# Patient Record
Sex: Male | Born: 1970
Health system: Southern US, Community
[De-identification: ages and names within clinical notes are randomized; demographics above are authoritative.]

## PROBLEM LIST (undated history)

## (undated) DIAGNOSIS — K5792 Diverticulitis of intestine, part unspecified, without perforation or abscess without bleeding: Secondary | ICD-10-CM

## (undated) DIAGNOSIS — R7982 Elevated C-reactive protein (CRP): Secondary | ICD-10-CM

## (undated) DIAGNOSIS — I10 Essential (primary) hypertension: Secondary | ICD-10-CM

## (undated) DIAGNOSIS — Z87442 Personal history of urinary calculi: Secondary | ICD-10-CM

## (undated) DIAGNOSIS — E669 Obesity, unspecified: Secondary | ICD-10-CM

## (undated) DIAGNOSIS — N289 Disorder of kidney and ureter, unspecified: Secondary | ICD-10-CM

## (undated) DIAGNOSIS — M1990 Unspecified osteoarthritis, unspecified site: Secondary | ICD-10-CM

## (undated) DIAGNOSIS — Z8639 Personal history of other endocrine, nutritional and metabolic disease: Secondary | ICD-10-CM

## (undated) DIAGNOSIS — G4730 Sleep apnea, unspecified: Secondary | ICD-10-CM

## (undated) DIAGNOSIS — C61 Malignant neoplasm of prostate: Secondary | ICD-10-CM

## (undated) DIAGNOSIS — R972 Elevated prostate specific antigen [PSA]: Secondary | ICD-10-CM

## (undated) DIAGNOSIS — K219 Gastro-esophageal reflux disease without esophagitis: Secondary | ICD-10-CM

## (undated) DIAGNOSIS — K209 Esophagitis, unspecified without bleeding: Secondary | ICD-10-CM

## (undated) DIAGNOSIS — K2970 Gastritis, unspecified, without bleeding: Secondary | ICD-10-CM

## (undated) HISTORY — DX: Personal history of other endocrine, nutritional and metabolic disease: Z86.39

## (undated) HISTORY — DX: Gastro-esophageal reflux disease without esophagitis: K21.9

## (undated) HISTORY — PX: PROSTATE BIOPSY: SHX241

## (undated) HISTORY — DX: Gastritis, unspecified, without bleeding: K29.70

## (undated) HISTORY — PX: ROTATOR CUFF REPAIR: SHX139

## (undated) HISTORY — PX: KNEE ARTHROSCOPY: SUR90

## (undated) HISTORY — PX: FINGER SURGERY: SHX640

## (undated) HISTORY — DX: Unspecified osteoarthritis, unspecified site: M19.90

## (undated) HISTORY — DX: Obesity, unspecified: E66.9

## (undated) HISTORY — PX: TONSILLECTOMY AND ADENOIDECTOMY: SHX28

## (undated) HISTORY — DX: Elevated C-reactive protein (CRP): R79.82

## (undated) HISTORY — DX: Esophagitis, unspecified without bleeding: K20.90

## (undated) HISTORY — DX: Diverticulitis of intestine, part unspecified, without perforation or abscess without bleeding: K57.92

## (undated) HISTORY — DX: Elevated prostate specific antigen (PSA): R97.20

---

## 1998-02-09 ENCOUNTER — Ambulatory Visit (HOSPITAL_COMMUNITY): Admission: RE | Admit: 1998-02-09 | Discharge: 1998-02-09 | Payer: Self-pay | Admitting: Gastroenterology

## 1998-03-19 ENCOUNTER — Ambulatory Visit: Admission: RE | Admit: 1998-03-19 | Discharge: 1998-03-19 | Payer: Self-pay | Admitting: Internal Medicine

## 1998-08-30 ENCOUNTER — Ambulatory Visit: Admission: RE | Admit: 1998-08-30 | Discharge: 1998-08-30 | Payer: Self-pay | Admitting: Internal Medicine

## 2001-12-09 ENCOUNTER — Ambulatory Visit (HOSPITAL_COMMUNITY): Admission: RE | Admit: 2001-12-09 | Discharge: 2001-12-10 | Payer: Self-pay | Admitting: Otolaryngology

## 2001-12-09 ENCOUNTER — Encounter: Payer: Self-pay | Admitting: Otolaryngology

## 2001-12-09 ENCOUNTER — Encounter (INDEPENDENT_AMBULATORY_CARE_PROVIDER_SITE_OTHER): Payer: Self-pay | Admitting: *Deleted

## 2005-11-15 ENCOUNTER — Emergency Department (HOSPITAL_COMMUNITY): Admission: EM | Admit: 2005-11-15 | Discharge: 2005-11-15 | Payer: Self-pay | Admitting: Emergency Medicine

## 2007-10-20 ENCOUNTER — Emergency Department (HOSPITAL_BASED_OUTPATIENT_CLINIC_OR_DEPARTMENT_OTHER): Admission: EM | Admit: 2007-10-20 | Discharge: 2007-10-20 | Payer: Self-pay | Admitting: Emergency Medicine

## 2008-05-02 ENCOUNTER — Ambulatory Visit: Payer: Self-pay | Admitting: *Deleted

## 2008-05-03 ENCOUNTER — Observation Stay (HOSPITAL_COMMUNITY): Admission: EM | Admit: 2008-05-03 | Discharge: 2008-05-05 | Payer: Self-pay | Admitting: Emergency Medicine

## 2008-05-03 ENCOUNTER — Encounter (INDEPENDENT_AMBULATORY_CARE_PROVIDER_SITE_OTHER): Payer: Self-pay | Admitting: Internal Medicine

## 2009-09-29 ENCOUNTER — Ambulatory Visit: Payer: Self-pay | Admitting: Diagnostic Radiology

## 2009-09-29 ENCOUNTER — Encounter: Payer: Self-pay | Admitting: Emergency Medicine

## 2009-09-29 ENCOUNTER — Observation Stay (HOSPITAL_COMMUNITY): Admission: EM | Admit: 2009-09-29 | Discharge: 2009-09-30 | Payer: Self-pay | Admitting: Internal Medicine

## 2010-04-27 LAB — CBC
HCT: 42.6 % (ref 39.0–52.0)
Hemoglobin: 14.2 g/dL (ref 13.0–17.0)
MCH: 30.2 pg (ref 26.0–34.0)
MCV: 90.3 fL (ref 78.0–100.0)
Platelets: 199 10*3/uL (ref 150–400)
Platelets: 174 10*3/uL (ref 150–400)
RBC: 4.72 MIL/uL (ref 4.22–5.81)
RBC: 4.41 MIL/uL (ref 4.22–5.81)
RDW: 14.4 % (ref 11.5–15.5)
WBC: 7.9 10*3/uL (ref 4.0–10.5)

## 2010-04-27 LAB — POCT CARDIAC MARKERS: Troponin i, poc: <0.05 ng/mL (ref 0.00–0.09)

## 2010-04-27 LAB — COMPREHENSIVE METABOLIC PANEL
AST: 31 U/L (ref 0–37)
Albumin: 3.8 g/dL (ref 3.5–5.2)
Alkaline Phosphatase: 64 U/L (ref 39–117)
BUN: 13 mg/dL (ref 6–23)
Chloride: 106 mEq/L (ref 96–112)
Creatinine, Ser: 1.15 mg/dL (ref 0.4–1.5)
GFR calc Af Amer: >60 mL/min (ref >60)
Potassium: 3.8 mEq/L (ref 3.5–5.1)
Total Bilirubin: 0.8 mg/dL (ref 0.3–1.2)
Total Protein: 6.9 g/dL (ref 6.0–8.3)

## 2010-04-27 LAB — D-DIMER, QUANTITATIVE: D-Dimer, Quant: 0.29 ug/mL-FEU (ref 0.00–0.48)

## 2010-04-27 LAB — GLUCOSE, CAPILLARY
Glucose-Capillary: 103 mg/dL — ABNORMAL HIGH (ref 70–99)
Glucose-Capillary: 114 mg/dL — ABNORMAL HIGH (ref 70–99)
Glucose-Capillary: 143 mg/dL — ABNORMAL HIGH (ref 70–99)

## 2010-04-27 LAB — CARDIAC PANEL(CRET KIN+CKTOT+MB+TROPI)
CK, MB: 2.1 ng/mL (ref 0.3–4.0)
Relative Index: 0.2 (ref 0.0–2.5)
Total CK: 931 U/L — ABNORMAL HIGH (ref 7–232)
Total CK: 948 U/L — ABNORMAL HIGH (ref 7–232)
Troponin I: 0.02 ng/mL (ref 0.00–0.06)
Troponin I: 0.02 ng/mL (ref 0.00–0.06)
Troponin I: 0.03 ng/mL (ref 0.00–0.06)

## 2010-04-27 LAB — LIPID PANEL
LDL Cholesterol: 35 mg/dL (ref 0–99)
Total CHOL/HDL Ratio: 2.6 RATIO
Triglycerides: 84 mg/dL (ref <150)
VLDL: 17 mg/dL (ref 0–40)

## 2010-04-27 LAB — DIFFERENTIAL
Eosinophils Absolute: 0.1 10*3/uL (ref 0.0–0.7)
Eosinophils Relative: 1 % (ref 0–5)
Lymphocytes Relative: 28 % (ref 12–46)
Lymphs Abs: 2.7 10*3/uL (ref 0.7–4.0)
Monocytes Absolute: 0.8 10*3/uL (ref 0.1–1.0)
Monocytes Relative: 8 % (ref 3–12)

## 2010-04-27 LAB — BASIC METABOLIC PANEL
CO2: 30 mEq/L (ref 19–32)
Chloride: 101 mEq/L (ref 96–112)
Creatinine, Ser: 1.4 mg/dL (ref 0.4–1.5)
GFR calc Af Amer: >60 mL/min (ref >60)
Potassium: 3.9 mEq/L (ref 3.5–5.1)

## 2010-05-24 LAB — BASIC METABOLIC PANEL
BUN: 11 mg/dL (ref 6–23)
BUN: 9 mg/dL (ref 6–23)
CO2: 25 mEq/L (ref 19–32)
Calcium: 8.9 mg/dL (ref 8.4–10.5)
Chloride: 104 mEq/L (ref 96–112)
Chloride: 106 mEq/L (ref 96–112)
Chloride: 106 mEq/L (ref 96–112)
GFR calc Af Amer: >60 mL/min (ref >60)
GFR calc non Af Amer: >60 mL/min (ref >60)
GFR calc non Af Amer: >60 mL/min (ref >60)
GFR calc non Af Amer: >60 mL/min (ref >60)
Glucose, Bld: 110 mg/dL — ABNORMAL HIGH (ref 70–99)
Glucose, Bld: 122 mg/dL — ABNORMAL HIGH (ref 70–99)
Glucose, Bld: 133 mg/dL — ABNORMAL HIGH (ref 70–99)
Potassium: 3.8 mEq/L (ref 3.5–5.1)
Potassium: 4.1 mEq/L (ref 3.5–5.1)
Potassium: 4.9 mEq/L (ref 3.5–5.1)
Sodium: 134 mEq/L — ABNORMAL LOW (ref 135–145)
Sodium: 138 mEq/L (ref 135–145)
Sodium: 140 mEq/L (ref 135–145)

## 2010-05-24 LAB — CBC
HCT: 40.3 % (ref 39.0–52.0)
HCT: 41.5 % (ref 39.0–52.0)
HCT: 42.3 % (ref 39.0–52.0)
Hemoglobin: 13.8 g/dL (ref 13.0–17.0)
Hemoglobin: 14.3 g/dL (ref 13.0–17.0)
MCV: 89 fL (ref 78.0–100.0)
MCV: 89.2 fL (ref 78.0–100.0)
MCV: 89.5 fL (ref 78.0–100.0)
Platelets: 174 10*3/uL (ref 150–400)
Platelets: 177 10*3/uL (ref 150–400)
Platelets: 214 10*3/uL (ref 150–400)
RBC: 4.75 MIL/uL (ref 4.22–5.81)
RDW: 14.7 % (ref 11.5–15.5)
RDW: 14.8 % (ref 11.5–15.5)
RDW: 14.8 % (ref 11.5–15.5)
WBC: 6.8 10*3/uL (ref 4.0–10.5)
WBC: 8.9 10*3/uL (ref 4.0–10.5)

## 2010-05-24 LAB — TROPONIN I: Troponin I: 0.12 ng/mL — ABNORMAL HIGH (ref 0.00–0.06)

## 2010-05-24 LAB — GLUCOSE, CAPILLARY
Glucose-Capillary: 107 mg/dL — ABNORMAL HIGH (ref 70–99)
Glucose-Capillary: 117 mg/dL — ABNORMAL HIGH (ref 70–99)
Glucose-Capillary: 133 mg/dL — ABNORMAL HIGH (ref 70–99)
Glucose-Capillary: 166 mg/dL — ABNORMAL HIGH (ref 70–99)
Glucose-Capillary: 79 mg/dL (ref 70–99)

## 2010-05-24 LAB — CARDIAC PANEL(CRET KIN+CKTOT+MB+TROPI)
CK, MB: 1.1 ng/mL (ref 0.3–4.0)
Relative Index: 0.2 (ref 0.0–2.5)
Relative Index: 0.2 (ref 0.0–2.5)

## 2010-05-24 LAB — DIFFERENTIAL
Eosinophils Absolute: 0.1 10*3/uL (ref 0.0–0.7)
Eosinophils Relative: 2 % (ref 0–5)
Lymphocytes Relative: 29 % (ref 12–46)
Lymphs Abs: 2.5 10*3/uL (ref 0.7–4.0)
Monocytes Absolute: 0.7 10*3/uL (ref 0.1–1.0)
Monocytes Relative: 8 % (ref 3–12)

## 2010-05-24 LAB — LIPID PANEL
Cholesterol: 133 mg/dL (ref 0–200)
LDL Cholesterol: 95 mg/dL (ref 0–99)
Triglycerides: 30 mg/dL (ref <150)

## 2010-05-24 LAB — POCT CARDIAC MARKERS
CKMB, poc: <1 ng/mL — ABNORMAL LOW (ref 1.0–8.0)
CKMB, poc: <1 ng/mL — ABNORMAL LOW (ref 1.0–8.0)

## 2010-05-24 LAB — D-DIMER, QUANTITATIVE: D-Dimer, Quant: 0.42 ug/mL-FEU (ref 0.00–0.48)

## 2010-05-24 LAB — HEMOGLOBIN A1C: Mean Plasma Glucose: 140 mg/dL

## 2010-05-24 LAB — CK TOTAL AND CKMB (NOT AT ARMC): Relative Index: 0.7 (ref 0.0–2.5)

## 2010-05-24 LAB — TSH: TSH: 2.446 u[IU]/mL (ref 0.350–4.500)

## 2010-05-24 LAB — APTT: aPTT: 32 seconds (ref 24–37)

## 2010-06-04 ENCOUNTER — Other Ambulatory Visit: Payer: Self-pay | Admitting: Emergency Medicine

## 2010-06-04 DIAGNOSIS — M25561 Pain in right knee: Secondary | ICD-10-CM

## 2010-06-08 ENCOUNTER — Ambulatory Visit
Admission: RE | Admit: 2010-06-08 | Discharge: 2010-06-08 | Disposition: A | Discharge status: Home / Self Care | Payer: BC Managed Care – PPO | Source: Ambulatory Visit | Attending: Emergency Medicine | Admitting: Emergency Medicine

## 2010-06-08 DIAGNOSIS — M25561 Pain in right knee: Secondary | ICD-10-CM

## 2010-06-26 NOTE — Discharge Summary (Signed)
NAME:  CHA, Edgar Kidd NO.:  [id number]   MEDICAL RECORD NO.:  [id number]           PATIENT TYPE:   LOCATION:                                 FACILITY:   PHYSICIAN:  Ruthy Dick, MD    DATE OF BIRTH:  Jul 13, 1970   DATE OF ADMISSION:  DATE OF DISCHARGE:                               DISCHARGE SUMMARY   REASON FOR ADMISSION:  Chest pain.   FINAL DISCHARGE DIAGNOSES:  1. Atypical chest pain.  2. Hypertension.  3. Morbid obesity.  4. Obstructive sleep apnea.  5. Diabetes mellitus, type 2.  6. Gastroesophageal reflux disease.   CONSULTATIONS DURING THIS ADMISSION:  Cardiology consult.   PROCEDURES DONE DURING THIS ADMISSION:  1. A 2-D echocardiogram, which showed a 55-65% ejection fraction with      no wall abnormalities.  2. Left heart catheterization was done and was read as no      angiographically significant coronary artery disease but with large      tortuous coronary arteries.  They flow in all coronary arteries was      slightly sluggish, possibly due to underlying microvascular      disease, and ejection fraction was 65%.   HISTORY OF PRESENTING ILLNESS:  This is a 40 year old African American  male with a past medical history significant for morbid obesity;  diabetes mellitus, type 2; hypertension; gastroesophageal reflux disease  who came into the hospital with chest pain, which he described as  pressure.  Because of this, he was admitted to rule out myocardial  infarction.  Serial enzymes and EKG were mostly negative except for the  first set, which showed a troponin of 0.12.  Because of this and the  fact that his chest pain was recurrent, he was recommended for left  heart catheterization.  The result as noted above did not show any  significant coronary artery disease.  Their recommendation is for the  patient to have risk factor modifications, which includes obesity,  hypertension, diabetes mellitus, and also to be started on Imdur 30 mg  daily.  He is to follow up with Dr. Anne Fu on Tuesday, May 17, 2008, at  10:30, also to follow up with his primary care doctor at [facility], Dr. Kizzie Fantasia, in 1 week.  The patient is to call for this  appointment.   DISCHARGE MEDICATIONS:  1. Imdur 30 mg once daily.  2. Aspirin 81 mg p.o. daily, over the counter.  3. Metformin 500 mg p.o. daily as previously taken at home.  4. Diovan 160 mg p.o. daily.  5. Prevacid 30 mg p.o. daily.  6. Vitamin D 1 tablet p.o. daily.   PHYSICAL EXAMINATION:  GENERAL:  He has no major complaints today.  He  denies chest pain but says that he does have a slight discomfort in the  area but not when compared to when he came to the hospital.  Denies  shortness of breath.  Denies abdominal pain, nausea, vomiting, diarrhea,  constipation, syncope.  HEENT:  Normocephalic, atraumatic.  Pupils equal, round, and reactive to  light.  Extraocular muscles intact.  Nares  patent.  VITAL SIGNS:  Temperature 98.1, pulse 95, respirations 20, blood  pressure 106/62 and saturating 100% on room air.  CHEST:  Clear to auscultation bilaterally.  ABDOMEN:  Soft, nontender.  EXTREMITIES:  No clubbing, cyanosis, or edema.  CARDIOVASCULAR:  First and second heart sounds only.  CENTRAL NERVOUS SYSTEM:  Nonfocal.   The patient was advised to be on high fiber diet for his morbid obesity.   TIME USED FOR DISCHARGE PLANNING:  Greater than 30 minutes.      Ruthy Dick, MD  Electronically Signed     GU/MEDQ  D:  05/05/2008  T:  05/05/2008  Job:  [id number]

## 2010-06-26 NOTE — Consult Note (Signed)
NAME:  Edgar Kidd, Edgar Kidd NO.:  [id number]   MEDICAL RECORD NO.:  [id number]          PATIENT TYPE:  OBV   LOCATION:  1824                         FACILITY:  [facility]   PHYSICIAN:  Unice Cobble, MD     DATE OF BIRTH:  Dec 02, 1970   DATE OF CONSULTATION:  05/03/2008  DATE OF DISCHARGE:                                 CONSULTATION   CHIEF COMPLAINT:  Chest pain.   HISTORY OF PRESENT ILLNESS:  This is a 40 year old African American male  with a history of borderline diabetes and hypertension and low vitamin D  who presents with chest pain.  The chest pain occurred while driving at  1610.  It was substernal and radiating to his left side and was  associated with shortness of breath and feeling hot.  The pain continued  unabated until seen in the emergency department at 2300.  Exertion and  rest did not affect the pain.  The patient was given morphine which  helped with the pain considerably.  He was thereafter given  nitroglycerin and aspirin which the patient is unsure whether it had an  affect.  He has never had pain like this before.  No palpitations or  presyncope associated with it.  The pain did not radiate to his back.  No edema, PND or orthopnea.  He can exert himself freely otherwise.   PAST MEDICAL HISTORY:  1. Borderline diabetes.  2. Hypertension.  3. Low vitamin D.  4. GERD.  5. Status post rotator cuff repair.  6. Status post sleep apnea surgery.  7. Status post right hand surgery.   ALLERGIES:  SHELLFISH.   MEDICATIONS:  1. Diovan 160 mg daily.  2. Vitamin D 5000 units weekly.  3. Glucophage 500 mg daily.  4. Prevacid.   SOCIAL HISTORY:  He lives in [place] with his wife and two kids.  He  drives a truck for occupation.  He does not smoke.  Occasional alcohol.  No drugs.   FAMILY HISTORY:  Noncontributory.   REVIEW OF SYSTEMS:  A complete review of systems done and found to be  otherwise negative except as stated in the HPI.   PHYSICAL  EXAMINATION:  VITAL SIGNS:  Temperature is afebrile with a  pulse of 80, respiratory rate is 20, blood pressure is 154/96 (this was  retaken with a larger cuff and was found to be in the 120s systolic (O2  saturations are 96% on 2 liters).  GENERAL:  No acute distress.  Very obese.  HEENT:  Shows NCAT, PERRLA, EOMI, MMM.  Oropharynx without erythema or  exudates.  NECK:  Supple without lymphadenopathy, thyromegaly, bruits or  jugulovenous distention.  HEART:  Regular rate and rhythm with normal S1-S2 without murmurs,  gallops or rubs.  Normal PMI.  Pulses are 2+ and equal bilaterally  without bruits.  LUNGS:  Clear to auscultation bilaterally.  SKIN:  No rashes or lesions.  ABDOMEN:  Soft and nontender with normal bowel sounds.  No rebound or  guarding.  EXTREMITIES:  Showed no cyanosis, clubbing or edema.  Pulses  are 2+ and equal bilaterally.  There is no brachial femoral delay.  MUSCULOSKELETAL:  Shows no joint deformity, effusions, spine or CVA  tenderness.  His chest pain is not reproducible.  NEUROLOGICAL:  He is alert and oriented x3 with cranial nerves II-XII  grossly intact.  Strength is 5/5 in all extremities and axial groups.   RADIOLOGY:  Chest x-ray shows mild cardiomegaly.  EKG shows a rate of  83, normal sinus rhythm.  He has lateral T-wave flattening and left  anterior fascicular block.  Borderline left ventricular hypertrophy.   LABORATORY DATA:  Normal white count and hemoglobin.  Creatinine is 1.  CK-MB is negative x2.  D-dimer is 0.42, which is also negative.   ASSESSMENT/PLAN:  This is a 40 year old black male with history of  diabetes and hypertension who presents with chest pain and lateral T-  wave flattening on ECG.  Based on the character and duration of the  pain, I suspect a noncoronary etiology, especially with his negative  troponins after 10 hours of pain.  The differential diagnosis includes  angina, but also includes pericarditis, aortic dissection,  esophageal  spasm, pleural effusion, or other noncardiac pain.  I recommend ruling  out for myocardial infarction overnight with serial enzymes and EKGs.  If these are negative, then he could have a cardiac stress test in the  morning. His lipid profile should be checked and a statin started if his  LDL is greater than 100 since he is a diabetic.  Glucophage should be  held for possible catheterization.  Continue Diovan.  Check ESR for  possible pericarditis.  Control chest pain with nitroglycerin  transdermally or intravenously or sublingually if possible.  Consider  repeating his AP and lateral chest x-ray to better evaluate his  mediastinum, which appears wide, flat and rotated on his portable chest  x-ray.  We will follow along with you and reevaluate in the morning.  Thank you very much for this consultation.      Unice Cobble, MD  Electronically Signed     ACJ/MEDQ  D:  05/03/2008  T:  05/03/2008  Job:  [id number]

## 2010-06-26 NOTE — H&P (Signed)
NAME:  Edgar Kidd, Edgar Kidd NO.:  [id number]   MEDICAL RECORD NO.:  [id number]          PATIENT TYPE:  OBV   LOCATION:  4731                         FACILITY:  [facility]   PHYSICIAN:  Ruthy Dick, MD    DATE OF BIRTH:  [birth year]   DATE OF ADMISSION:  05/02/2008  DATE OF DISCHARGE:                              HISTORY & PHYSICAL   CHIEF COMPLAINT:  Chest pain which started about 1 o'clock yesterday.   HISTORY OF PRESENT ILLNESS:  Edgar Kidd is a 40 year old African  American male with a past medical history significant for morbid  obesity, diabetes mellitus type 2, hypertension, gastroesophageal reflux  disease, and obstructive sleep apnea for which he uses CPAP at home who  said that he started having chest pain yesterday afternoon, that is  March 22, at about 1 p.m.  According to him, the chest time was about  6/10, intensity was more like sharp and also pressure-like in nature,  and radiating sometimes to his left arm, but otherwise no nausea or  vomiting.  He did complain of shortness of breath which he said has been  going on for some time now.  He has baseline shortness of breath because  he has sleep apnea and morbid obesity.  Denied abdominal pain.  Denied  diarrhea, constipation, hematochezia, melanotic stools.  He denies  dysuria, frequency, urgency.  He denies headache, diplopia, photophobia.  Denies syncope.  The patient denies claudication.  Denies rash.  Denies  diaphoresis during this episode.  Denies fever and chills.   PAST MEDICAL HISTORY:  Gastroesophageal reflux disease, diabetes  mellitus type 2, hypertension, obstructive sleep apnea, morbid obesity.   SOCIAL HISTORY:  Lives at home.  Denies tobacco and illicit drug use,  and says he drinks only occasionally.   FAMILY HISTORY:  Significant for mother with diabetes and father with  hypertension.  No heart disease at this time.  Other family members also  have diabetes and hypertension.   MEDICATIONS:  1. Diovan 160 mg daily.  2. Prevacid 30 mg daily.  3. Vitamin D.  4. Glucophage 500 mg daily.   ALLERGIES:  He is allergic to [facility].   REVIEW OF SYSTEMS:  A 12-point review of systems was done was negative  except noted in the history of presenting illness.   PHYSICAL EXAMINATION:  GENERAL:  Seen and examined in the emergency  room.  Alert and oriented x3 in no acute cardiopulmonary distress.  VITAL SIGNS:  Temperature 96.1, pulse 80, respirations 20, blood  pressure 165/111, but he says this was done with a cuff on his forearm  since there was no cuff big enough for his upper arm.  We plan to redo  his blood pressure.  Saturation 98% on room air.  HEENT:  Normocephalic, atraumatic.  Pupils equal, round, and reactive to  light.  Extraocular muscles intact.  Nares patent.  NECK:  Supple.  No JVD.  No lymphadenopathy.  No thyromegaly.  CHEST:  Clear to auscultation bilaterally.  No rhonchi, no rales, no  wheezing.  ABDOMEN:  Soft, nontender.  No hepatosplenomegaly.  EXTREMITIES:  No clubbing,  no cyanosis, no edema.  CARDIOVASCULAR:  First and second heart sounds only.  CENTRAL NERVOUS SYSTEM:  Nonfocal.  Cranial nerves intact II through  XII.  The patient has normal reflexes and power is 5/5 globally.   LABS AND INVESTIGATIONS:  CBC with differential is fairly normal.  D-  dimer is 0.42.  Glucose is 140, sodium 140, potassium 3.8, BUN 12,  creatinine 1.02.  CK-MB was less than 1.0 and troponin less than 0.05.  Myoglobin 86.  Chest x-rays read as mild cardiac enlargement with  questionable minimal bibasilar atelectasis and no acute infiltrates.  EKG shows what appears to be RSR pattern in V2 and possibly representing  a left anterior fascicular block.   ASSESSMENT:  1. Chest pain, rule out myocardial infarction.  2. Morbid obesity.  3. Hypertension.  4. Obstructive sleep apnea.  5. Diabetes mellitus type 2.  6. Gastroesophageal reflux disease.  7. Abnormal  electrocardiogram.   PLAN:  The patient will be admitted on observation to be ruled out for  myocardial infarction with serial enzymes and EKGs.  We would watch his  blood pressure very closely and try to keep it under control if the  numbers noted above or actually correct.  We will also have him on  sliding scale insulin.  When the patient rules out for myocardial  infarction with enzymes, we will plan for the patient to have either  inpatient stress test or outpatient stress test.  However, since the  patient does have also a slightly abnormal EKG and has other numerous  risk factors, we will plan to get Cardiology involved in his case.  In  the meantime, we will get a TSH and lipid panel in the morning.      Ruthy Dick, MD  Electronically Signed     GU/MEDQ  D:  05/03/2008  T:  05/03/2008  Job:  [id number]

## 2010-06-26 NOTE — Cardiovascular Report (Signed)
NAME:  Edgar Kidd, Edgar Kidd NO.:  [id number]   MEDICAL RECORD NO.:  [id number]          PATIENT TYPE:  OBV   LOCATION:  4738                         FACILITY:  [facility]   PHYSICIAN:  Jake Bathe, MD      DATE OF BIRTH:  1970-05-26   DATE OF PROCEDURE:  05/04/2008  DATE OF DISCHARGE:                            CARDIAC CATHETERIZATION   PROCEDURE:  1. Left heart catheterization  2. Selective coronary angiography.  3. Left ventriculogram.   INDICATIONS:  Unstable angina.  A 40 year old [place] American male with  diabetes, hypertension, morbid obesity, and obstructive sleep apnea, who  is admitted with chest discomfort and hypertension.  The first set of  cardiac biomarkers were positive with a troponin of 0.12 with subsequent  markers being in the normal range.  He still continued to have ongoing  chest discomfort, therefore cardiac catheterization was pursued.   PROCEDURE IN DETAILS:  Informed consent was obtained.  Risk of stroke,  heart attack, death, renal impairment, bleeding, and arterial damage  were explained to the patient at length.  He was placed on  catheterization table and prepped in a sterile fashion.  Fluoroscopic  visualization of the femoral head was obtained.  A single stick into the  right femoral artery was obtained and using the modified Seldinger  technique a 5-French sheath was placed in the right femoral artery.  A  Judkins left #5 catheter was used to selectively cannulate the left main  artery.  A left #4 catheter did not selectively fit.  This catheter was  then exchanged for a JR-4 catheter which was used to selectively  cannulate the right coronary artery.  Multiple views with hand injection  of Omnipaque were obtained.  This catheter was then exchanged for an  angle pigtail which was used across the aortic valve in the left  ventricle.  Hemodynamics were obtained in the RAO position using power  injection of 32 mL of contrast.  A  ventriculogram was performed.  Following this, a pullback was obtained across the aortic valve.  Following the procedure, sheath was removed.  He was hemodynamically  stable without any difficulty.  He did receive Lovenox injection at 6  a.m. the morning of catheterization.   FINDINGS:  1. Left main artery - no angiographically significant coronary artery      disease, large vessel.  2. Left anterior descending artery - no angiographically significant      coronary artery disease, gives rise to 2 diagonal branches, the      first of which is quite large in diameter.  3. Circumflex artery - no angiographically significant coronary artery      disease.  This vessel gives rise to 2 obtuse marginal branches.  4. Right coronary artery - no angiographically significant coronary      artery disease, dominant vessel giving rise to the PDA.   All coronary arteries were large in diameter, 5-6 mm vessels and were  tortuous.   LEFT VENTRICULOGRAM:  Normal ejection fraction of 65% with no wall  motion abnormalities.  No significant mitral regurgitation detected.   HEMODYNAMICS:  Left  ventricular systolic pressure 121 with an end-  diastolic pressure of 23 mmHg.  Aortic pressure of 121/89 with a mean of  105 mmHg.  There was no gradient across the aortic valve.   IMPRESSION:  1. No angiographically significant coronary artery disease with large      tortuous coronary arteries.  The flow down all coronary arteries      was slightly sluggish, possibly indicative of underlying      microvascular disease.  2. Normal left ventricular ejection fraction of 65% with no wall      motion abnormality.  3. Elevated left ventricular end-diastolic pressure consistent with      diastolic dysfunction.   PLAN:  We will go ahead and initiate Imdur 30 mg once a day for possible  microvasculature disease.  We will continue current antihypertensive  regimen.  Once ambulatory, okay with discharge.  We will see  him back in  the office on May 17, 2008, at 10:30 in the morning.  The patient did  have a noted SHELLFISH allergy, which caused facial swelling and throat  swelling according to the patient.  He was premedicated with prednisone  as well as Solu-Medrol prior to catheterization.  He demonstrated no  clinical findings consistent with anaphylaxis.  We will monitor.      Jake Bathe, MD  Electronically Signed     MCS/MEDQ  D:  05/04/2008  T:  05/05/2008  Job:  [id number]

## 2010-06-29 NOTE — H&P (Signed)
NAME:  Edgar Kidd, Edgar Kidd                      ACCOUNT NO.:  [id number]   MEDICAL RECORD NO.:  [id number]                   PATIENT TYPE:  OIB   LOCATION:  2550                                 FACILITY:  [facility]   PHYSICIAN:  Hermelinda Medicus, MD                  DATE OF BIRTH:  25-Dec-1970   DATE OF ADMISSION:  12/09/2001  DATE OF DISCHARGE:                                HISTORY & PHYSICAL   HISTORY OF PRESENT ILLNESS:  This patient is a 40 year old male who is  having problems with sleep apnea.  He weighs, according to his status at  this point, 334, he is 6-feet tall, he is a former Land, he is  now a Naval architect and has been on CPAP but he states that the CPAP is a  failure and just not working well for him.  His sleep study reveals a lowest  O2 of 71 with a 45 respiratory disturbance index and awakening 36 times, 8  times greater than 2 minutes, and he is spending about 19% of his time in  stage 3 and 4 and REM sleep and this should be over 30%.  He had some  frequent PACs, very loud snoring and he has been placed for a year and a  half on CPAP, which has failed quite badly.  His CPAP numbers were lowest O2  of 88 and he was still awake on several times and he had a better night's  sleep, 43% deep sleep, improved oxygenation; however, he is going to try to  avoid the CPAP, or at least use it with more care and less stress, in an  effort to improve his nasal airway and also, he has very large obstructive  tonsils and uvula and therefore we are planning to do a turbinate reduction  and tonsillectomy, uvulectomy and palatopharyngoplasty.   PAST MEDICAL HISTORY:  His past history is quite unremarkable other than the  fact that he has had a left hand trauma where he had a screw apparently put  in his hand in November 1998, and in 1985, he had a right thumb fracture  with repair of nerve and tendon.  He has some reflux esophagitis and has had  a history of peptic ulcer.   MEDICATIONS:  1. Prevacid 30 mg every day.  2. Allegra 180 mg every day.  3. Nasacort every day or Flonase every day.   ALLERGIES:  He has no allergies to medications.   SOCIAL HISTORY:  Drinks occasionally.  Does not smoke.   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  VITAL SIGNS:  Blood pressure 120/75, respirations 18 and heart rate is 85.  HEENT/NECK:  His ears are clear.  Tympanic membranes are clear.  Oral cavity  is very full.  He has somewhat of  a hot potato voice with very large  tonsils, high arched tongue, very full neck but his larynx is clear.  True  cords, false cords, epiglottis and base of tongue are clear.  His nose shows  some septal widening from the previous trauma but this is quite minimal and  his turbinates are very large and redundant, which I think we can out-  fracture and improve his nasal airway.  His neck is free of any thyromegaly,  cervical adenopathy or mass.  CHEST:  Chest is clear; no rales, rhonchi or wheezes.  CARDIOVASCULAR:  No opening snaps, murmurs or gallops.  ABDOMEN:  Abdomen obese but otherwise unremarkable.  No liver, spleen or  kidneys palpable.  EXTREMITIES:  Unremarkable except for the above-mentioned trauma.  NEUROLOGIC:  Oriented x 3.  Cranial nerves intact.    INITIAL DIAGNOSES:  1. Sleep apnea with nasal obstruction, tonsillar hypertrophy with obesity     with cardiac arrhythmias with failed continuous positive airway pressure     with fatigue and sleep deprivation.  2. Reflux esophagitis, on Prevacid.                                               Hermelinda Medicus, MD    JC/MEDQ  D:  12/09/2001  T:  12/09/2001  Job:  [id number]   cc:   Minerva Areola L. August Saucer, M.D.  [address] [address]  [place]  [place] [address]  Fax: [phone]   Clinton D. Young, M.D.  [address] [place]  [place] [address]  Fax: [phone]

## 2010-06-29 NOTE — Op Note (Signed)
NAME:  Edgar Kidd, Edgar Kidd                      ACCOUNT NO.:  [id number]   MEDICAL RECORD NO.:  [id number]                   PATIENT TYPE:  OIB   LOCATION:  2550                                 FACILITY:  [facility]   PHYSICIAN:  Hermelinda Medicus, MD                  DATE OF BIRTH:  1971/02/09   DATE OF PROCEDURE:  12/09/2001  DATE OF DISCHARGE:                                 OPERATIVE REPORT   PREOPERATIVE DIAGNOSES:  Sleep apnea with tonsillar hypertrophy with nasal  obstruction with obesity with failed CPAP.   POSTOPERATIVE DIAGNOSES:  Sleep apnea with tonsillar hypertrophy with nasal  obstruction with obesity with failed CPAP.   OPERATION PERFORMED:  Turbinate reduction with a tonsillectomy and  uvulopalatopharyngoplasty.   SURGEON:  Hermelinda Medicus, MD   ANESTHESIA:  General endotracheal.   ANESTHESIOLOGIST:  Dr. Gypsy Balsam.   DESCRIPTION OF PROCEDURE:  The patient was placed in supine position and  under general endotracheal anesthesia, the nasal turbinates were injected  with 1% Xylocaine with epinephrine and then they were aggressively  outfractured using the butter knife and no mucous membrane was removed and  we were able to gain considerable space within this patient's nose.  The  septum was in quite decent condition.  Once the turbinates were reduced and  we gained considerable space, we then placed Merocel packs and proceeded to  his oral cavity.  Once we readjusted and placed the tonsillar gag in a very  large mouth with a very large and very large tonsils and a uvula that was  probably three to four times the normal size.  His larynx was then  established as clear.  Tonsillar gag was placed and the tonsillectomy was  completed using Bovie electrocoagulation and blunt dissection.  All  hemostasis was established with Bovie electrocoagulation.  Once the tonsils  were removed which were extremely large, we gained a great deal of space in  his oral cavity where this would  markedly improve his sleep apnea.  His  uvula was also trimmed to about one third its previous size bringing it back  to more normal and then closure of the uvula mucous membrane was with 5-0  plain catgut anterior posterior suturing of mucous membrane.  The tonsillar  beds were then checked.  The nose was again checked.  Merocel was removed  and the anesthesia trumpets number eights were placed without difficulty.  Blood loss was approximately 85 to 90 cc.  Tonsillar beds were clear.  The  gag was slowly released as we observed the tonsillar beds and found them to  be clear and dry.  Then the patient was awakened and tolerated the procedure  very well.  He will be held overnight in 3300 for pulse oximeter evaluation  and care.  The patient's final diagnosis is sleep apnea, failed CPAP with  nasal obstruction, oral obstruction with very large tonsils and uvula and  palatal  hypertrophy.  His follow-up will be in intensive care 3300 on pulse  oximeter and  then he will use the CPAP as necessary but will try to once edema has been  resolved, will try to totally avoid the future use of CPAP.  His follow-up  will be in 3300 then in one week, three weeks, six weeks, three months, six  months and a year.                                                 Hermelinda Medicus, MD    JC/MEDQ  D:  12/09/2001  T:  12/09/2001  Job:  [id number]   cc:   Joni Fears D. Young, M.D.  [address] [place]  [place] [address]  Fax: [phone]   Lind Guest. August Saucer, M.D.  [address] [address]  [place]  [place] [address]  Fax: [phone]

## 2010-07-10 ENCOUNTER — Encounter (HOSPITAL_COMMUNITY)
Admission: RE | Admit: 2010-07-10 | Discharge: 2010-07-10 | Disposition: A | Discharge status: Home / Self Care | Payer: BC Managed Care – PPO | Source: Ambulatory Visit | Attending: Orthopedic Surgery | Admitting: Orthopedic Surgery

## 2010-07-10 LAB — COMPREHENSIVE METABOLIC PANEL
ALT: 29 U/L (ref 0–53)
AST: 21 U/L (ref 0–37)
CO2: 30 mEq/L (ref 19–32)
Chloride: 101 mEq/L (ref 96–112)
GFR calc Af Amer: >60 mL/min (ref >60)
GFR calc non Af Amer: 52 mL/min — ABNORMAL LOW (ref >60)
Sodium: 140 mEq/L (ref 135–145)
Total Bilirubin: 0.6 mg/dL (ref 0.3–1.2)

## 2010-07-10 LAB — PROTIME-INR: Prothrombin Time: 13.6 seconds (ref 11.6–15.2)

## 2010-07-10 LAB — URINALYSIS, ROUTINE W REFLEX MICROSCOPIC
Glucose, UA: NEGATIVE mg/dL
Hgb urine dipstick: NEGATIVE
Protein, ur: NEGATIVE mg/dL
Specific Gravity, Urine: 1.028 (ref 1.005–1.030)

## 2010-07-10 LAB — CBC
Hemoglobin: 16.4 g/dL (ref 13.0–17.0)
MCH: 30.2 pg (ref 26.0–34.0)
MCV: 86.9 fL (ref 78.0–100.0)
RBC: 5.43 MIL/uL (ref 4.22–5.81)

## 2010-07-12 ENCOUNTER — Ambulatory Visit (HOSPITAL_COMMUNITY)
Admission: RE | Admit: 2010-07-12 | Discharge: 2010-07-12 | Disposition: A | Discharge status: Home / Self Care | Payer: BC Managed Care – PPO | Source: Ambulatory Visit | Attending: Orthopedic Surgery | Admitting: Orthopedic Surgery

## 2010-07-12 DIAGNOSIS — M23302 Other meniscus derangements, unspecified lateral meniscus, unspecified knee: Secondary | ICD-10-CM | POA: Insufficient documentation

## 2010-07-12 DIAGNOSIS — K219 Gastro-esophageal reflux disease without esophagitis: Secondary | ICD-10-CM | POA: Insufficient documentation

## 2010-07-12 DIAGNOSIS — I10 Essential (primary) hypertension: Secondary | ICD-10-CM | POA: Insufficient documentation

## 2010-07-12 DIAGNOSIS — Z01812 Encounter for preprocedural laboratory examination: Secondary | ICD-10-CM | POA: Insufficient documentation

## 2010-07-12 DIAGNOSIS — M2340 Loose body in knee, unspecified knee: Secondary | ICD-10-CM | POA: Insufficient documentation

## 2010-07-12 DIAGNOSIS — G4733 Obstructive sleep apnea (adult) (pediatric): Secondary | ICD-10-CM | POA: Insufficient documentation

## 2010-07-12 DIAGNOSIS — E119 Type 2 diabetes mellitus without complications: Secondary | ICD-10-CM | POA: Insufficient documentation

## 2010-07-12 DIAGNOSIS — M2240 Chondromalacia patellae, unspecified knee: Secondary | ICD-10-CM | POA: Insufficient documentation

## 2010-07-12 DIAGNOSIS — E669 Obesity, unspecified: Secondary | ICD-10-CM | POA: Insufficient documentation

## 2010-07-12 DIAGNOSIS — M6580 Other synovitis and tenosynovitis, unspecified site: Secondary | ICD-10-CM | POA: Insufficient documentation

## 2010-07-12 DIAGNOSIS — M23305 Other meniscus derangements, unspecified medial meniscus, unspecified knee: Secondary | ICD-10-CM | POA: Insufficient documentation

## 2010-07-25 NOTE — Op Note (Signed)
NAME:  Edgar Kidd, Edgar Kidd NO.:  [id number]  MEDICAL RECORD NO.:  [id number]           PATIENT TYPE:  O  LOCATION:  SDSC                         FACILITY:  [facility]  PHYSICIAN:  Vania Rea. Quentin Shorey, M.D.  DATE OF BIRTH:  07-Feb-1971  DATE OF PROCEDURE:  07/12/2010 DATE OF DISCHARGE:  07/12/2010                              OPERATIVE REPORT   PREOPERATIVE DIAGNOSIS:  Right knee medial meniscus tear.  POSTOPERATIVE DIAGNOSES: 1. Right knee medial meniscus tear. 2. Right knee lateral meniscus tear. 3. 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 4. Multiple chondral loose bodies. 5. Synovitis.  PROCEDURE: 1. Right knee diagnostic arthroscopy. 2. Extensive synovectomy. 3. Removal of multiple cartilaginous loose bodies. 4. Chondroplasty at the patellofemoral joint. 5. Partial medial and partial lateral meniscectomies. 6. Chondroplasty at the medial and lateral compartments.  SURGEON:  Vania Rea. Hattie Pine, MD  ASSISTANT:  Lucita Lora. Shuford, PA-C  ANESTHESIA:  LMA general as well as local.  TOURNIQUET TIME:  None.  ESTIMATED BLOOD LOSS:  Minimal.  DRAINS:  None.  HISTORY:  Edgar Kidd is a 40 year old gentleman who has had persistent right primarily medial knee pain with mechanical symptoms that have been refractory to attempts at conservative management.  Due to his ongoing pain and functional limitations, he was brought to the operating room at this time for planned right knee arthroscopy as described below.  I preoperatively counseled Edgar Kidd on treatment options as well as risks versus benefits thereof.  Possible surgical complications were reviewed including the potential for bleeding, infection, neurovascular injury, DVT, PE as well as persistent pain.  He understands and accepts and agrees with our planned procedure.  PROCEDURE IN DETAIL:  After undergoing routine preop evaluation, the patient received prophylactic antibiotics.  He was brought  to the operating room, placed supine on the operating table and underwent smooth induction of an LMA general anesthesia.  The right leg was placed in the leg holder and sterilely prepped and draped in standard fashion. Time-out was called.  Standard arthroscopy ports were established and diagnostic arthroscopy was performed.  The suprapatellar pouch and gutter showed a number of small cartilaginous loose bodies and these were all evacuated through the arthroscopic canine as well as through the shaver.  The patellofemoral joint showed broad grade III chondromalacia throughout the trochlear groove with multiple large chondral flaps.  This area was debrided with a shaver to a smooth contour and all loose flaps were removed.  Intercondylar notch of the ACL to be intact with negative intraoperative Lachman.  Medially there was broad grade III chondromalacia in the medial femoral condyle and a complex tear involving the posterior half of the medial meniscus with a radial component at the junction between the middle and posterior thirds.  A basket was brought in and used to trim the meniscus back to a stable contour and a shaver was used for final contouring and removal of the meniscal fragments.  We also performed a chondroplasty of the medial femoral condyle.  Probing of the remaining peripheral rim in the medial meniscus was stable.  We then turned our attention laterally where there was a degenerative  tear involving primarily the mid third lateral meniscus and this was trimmed back to stable margin with a shaver.  In addition there was grade II chondromalacia in the lateral __________ with chondral fissuring and flaps that were all smooth to a stable base with a shaver.  We removed again multiple cartilaginous loose bodies from the posterior medial plus lateral compartments.  I also performed an extensive synovectomy, particularly in the anterior chamber.  The Arthrex wand was then used to  obtain hemostasis.  At this point a final inspection and irrigation was then completed.  Fluid and instruments were removed.  Combination of Marcaine, morphine, and clonidine still in the knee joint in addition to Marcaine about the portals.  Portals were closed with Steri-Strips.  A bulky dry dressing was applied to the right knee.  Leg was wrapped with Ace bandage and thigh-high support stocking. The patient was then awakened, extubated, and taken to recovery room in stable condition.     Vania Rea. Barrie Wale, M.D.     KMS/MEDQ  D:  07/12/2010  T:  07/13/2010  Job:  [id number]  Electronically Signed by Francena Hanly M.D. on 07/25/2010 08:19:46 AM

## 2010-11-14 LAB — URINE CULTURE

## 2010-11-14 LAB — URINALYSIS, ROUTINE W REFLEX MICROSCOPIC
Bilirubin Urine: NEGATIVE
Nitrite: NEGATIVE
Specific Gravity, Urine: 1.025
pH: 5

## 2010-11-14 LAB — URINE MICROSCOPIC-ADD ON

## 2011-06-12 ENCOUNTER — Emergency Department (HOSPITAL_COMMUNITY)
Admission: EM | Admit: 2011-06-12 | Discharge: 2011-06-12 | Disposition: A | Discharge status: Home / Self Care | Payer: BC Managed Care – PPO | Attending: Emergency Medicine | Admitting: Emergency Medicine

## 2011-06-12 ENCOUNTER — Encounter (HOSPITAL_COMMUNITY): Payer: Self-pay | Admitting: Family Medicine

## 2011-06-12 DIAGNOSIS — I10 Essential (primary) hypertension: Secondary | ICD-10-CM | POA: Insufficient documentation

## 2011-06-12 DIAGNOSIS — E119 Type 2 diabetes mellitus without complications: Secondary | ICD-10-CM | POA: Insufficient documentation

## 2011-06-12 DIAGNOSIS — N289 Disorder of kidney and ureter, unspecified: Secondary | ICD-10-CM | POA: Insufficient documentation

## 2011-06-12 DIAGNOSIS — N200 Calculus of kidney: Secondary | ICD-10-CM

## 2011-06-12 HISTORY — DX: Sleep apnea, unspecified: G47.30

## 2011-06-12 HISTORY — DX: Essential (primary) hypertension: I10

## 2011-06-12 LAB — URINE MICROSCOPIC-ADD ON

## 2011-06-12 LAB — POCT I-STAT, CHEM 8
BUN: 24 mg/dL — ABNORMAL HIGH (ref 6–23)
Calcium, Ion: 1.24 mmol/L (ref 1.12–1.32)
Chloride: 103 mEq/L (ref 96–112)

## 2011-06-12 LAB — URINALYSIS, ROUTINE W REFLEX MICROSCOPIC
Glucose, UA: NEGATIVE mg/dL
Ketones, ur: NEGATIVE mg/dL
pH: 5.5 (ref 5.0–8.0)

## 2011-06-12 MED ORDER — OXYCODONE-ACETAMINOPHEN 5-325 MG PO TABS: 2.0000 | ORAL_TABLET | ORAL | Status: AC | PRN

## 2011-06-12 MED ORDER — ONDANSETRON HCL 4 MG PO TABS: 4.0000 mg | ORAL_TABLET | Freq: Four times a day (QID) | ORAL | Status: AC

## 2011-06-12 MED ORDER — HYDROMORPHONE HCL PF 2 MG/ML IJ SOLN
2.0000 mg | Freq: Once | INTRAMUSCULAR | Status: AC
  Administered 2011-06-12: 2 mg via INTRAMUSCULAR
  Filled 2011-06-12: qty 1

## 2011-06-12 MED ORDER — ONDANSETRON 4 MG PO TBDP
8.0000 mg | ORAL_TABLET | Freq: Once | ORAL | Status: AC
  Administered 2011-06-12: 8 mg via ORAL
  Filled 2011-06-12: qty 2

## 2011-06-12 MED ORDER — OXYCODONE-ACETAMINOPHEN 5-325 MG PO TABS
2.0000 | ORAL_TABLET | Freq: Once | ORAL | Status: AC
  Administered 2011-06-12: 2 via ORAL
  Filled 2011-06-12: qty 2

## 2011-06-12 NOTE — Discharge Instructions (Signed)
USE URINARY STRAINER WHEN YOU URINATE TO CATCH ANY PASSED STONES. RETURN HERE WITH ANY SEVERE PAIN, UNCONTROLLED VOMITING, OR FEVER. TAKE MEDICATIONS AS PRESCRIBED. FOLLOW UP WITH YOUR PRIMARY CARE PROVIDER FOR RECHECK OF YOUR KIDNEY FUNCTION TESTS AS DISCUSSED.    Kidney Function Tests Healthy kidneys remove wastes and excess fluid from the blood. Blood tests show whether the kidneys are failing to remove wastes. Urine tests can show how quickly body wastes are being removed and whether the kidneys are leaking abnormal amounts of protein.  NORMAL FINDINGS Blood Tests Serum creatinine. Creatinine is a waste product that comes from meat protein in the diet and from the normal wear and tear on muscles of the body. Higher levels may be a sign that the kidneys are not working properly. As kidney disease progresses, the level of creatinine in the blood increases. Normal findings: 0.6 to 1.2 mg/dL Blood urea nitrogen (BUN). Urea nitrogen is also produced from the breakdown of food protein. As kidney function decreases, the BUN level increases. Normal findings: 7-20 mg/dL Ranges for normal findings may vary among different laboratories and hospitals. You should always check with your doctor after having lab work or other tests done to discuss the meaning of your test results and whether your values are considered within normal limits. URINE TESTS Some urine tests require only a few ounces of urine. But some tests require collection of all urine produced for a full 24 hours. A 24-hour urine test shows how much urine your kidneys produce in 1 day. The test is sometimes used to measure how much protein leaks from the kidney into the urine in 1 day. However, protein leakage can also be accurately determined in a small sample of urine by measuring its protein and creatinine concentration.  CREATININE CLEARANCE A creatinine clearance test compares the creatinine in a 24-hour sample of urine to the creatinine  level in the blood. This is to show how many milliliters of blood the kidneys are filtering out each minute (mL/min). The creatinine clearance can also be estimated from the serum creatinine alone using well established prediction equations.  MEANING OF TEST  Your caregiver will go over the test results with you and discuss the importance and meaning of your results, as well as treatment options and the need for additional tests if necessary. OBTAINING THE TEST RESULTS It is your responsibility to obtain your test results. Ask the lab or department performing the test when and how you will get your results. Document Released: 01/11/2008 Document Revised: 01/17/2011 Document Reviewed: 01/11/2008 [facility] Patient Information 2012 [place], [place].  Kidney Stones Kidney stones (ureteral lithiasis) are deposits that form inside your kidneys. The intense pain is caused by the stone moving through the urinary tract. When the stone moves, the ureter goes into spasm around the stone. The stone is usually passed in the urine.  CAUSES   A disorder that makes certain neck glands produce too much parathyroid hormone (primary hyperparathyroidism).   A buildup of uric acid crystals.   Narrowing (stricture) of the ureter.   A kidney obstruction present at birth (congenital obstruction).   Previous surgery on the kidney or ureters.   Numerous kidney infections.  SYMPTOMS   Feeling sick to your stomach (nauseous).   Throwing up (vomiting).   Blood in the urine (hematuria).   Pain that usually spreads (radiates) to the groin.   Frequency or urgency of urination.  DIAGNOSIS   Taking a history and physical exam.   Blood or urine  tests.   Computerized X-ray scan (CT scan).   Occasionally, an examination of the inside of the urinary bladder (cystoscopy) is performed.  TREATMENT   Observation.   Increasing your fluid intake.   Surgery may be needed if you have severe pain or persistent  obstruction.  The size, location, and chemical composition are all important variables that will determine the proper choice of action for you. Talk to your caregiver to better understand your situation so that you will minimize the risk of injury to yourself and your kidney.  HOME CARE INSTRUCTIONS   Drink enough water and fluids to keep your urine clear or pale yellow.   Strain all urine through the provided strainer. Keep all particulate matter and stones for your caregiver to see. The stone causing the pain may be as small as a grain of salt. It is very important to use the strainer each and every time you pass your urine. The collection of your stone will allow your caregiver to analyze it and verify that a stone has actually passed.   Only take over-the-counter or prescription medicines for pain, discomfort, or fever as directed by your caregiver.   Make a follow-up appointment with your caregiver as directed.   Get follow-up X-rays if required. The absence of pain does not always mean that the stone has passed. It may have only stopped moving. If the urine remains completely obstructed, it can cause loss of kidney function or even complete destruction of the kidney. It is your responsibility to make sure X-rays and follow-ups are completed. Ultrasounds of the kidney can show blockages and the status of the kidney. Ultrasounds are not associated with any radiation and can be performed easily in a matter of minutes.  SEEK IMMEDIATE MEDICAL CARE IF:   Pain cannot be controlled with the prescribed medicine.   You have a fever.   The severity or intensity of pain increases over 18 hours and is not relieved by pain medicine.   You develop a new onset of abdominal pain.   You feel faint or pass out.  MAKE SURE YOU:   Understand these instructions.   Will watch your condition.   Will get help right away if you are not doing well or get worse.  Document Released: 01/28/2005 Document  Revised: 01/17/2011 Document Reviewed: 05/26/2009 [facility] Patient Information 2012 [place], [place].

## 2011-06-12 NOTE — ED Provider Notes (Signed)
Medical screening examination/treatment/procedure(s) were performed by non-physician practitioner and as supervising physician I was immediately available for consultation/collaboration.   Glynn Octave, MD 06/12/11 [phone]

## 2011-06-12 NOTE — ED Notes (Signed)
Pt reports blood in urine X 2 days, c/o left flank pain 8/10 starting last night that radiates towards stomach with right sided pressure and some nausea. Pt stated when he urinates only a few drops come out since last night.

## 2011-06-12 NOTE — ED Notes (Signed)
Pt sts noticed hematuria a few days ago. Now comlaining of left flank pain and was unable to sleep last night. Hx of kidney stones

## 2011-06-12 NOTE — ED Provider Notes (Signed)
History     CSN: [id number]  Arrival date & time 06/12/11  0930   First MD Initiated Contact with Patient 06/12/11 1005      Chief Complaint  Patient presents with  . Nephrolithiasis    (Consider location/radiation/quality/duration/timing/severity/associated sxs/prior treatment) Patient is a 41 y.o. male presenting with flank pain. The history is provided by the patient.  Flank Pain This is a new problem. The current episode started in the past 7 days. The problem occurs constantly. The problem has been gradually worsening. Associated symptoms include abdominal pain and nausea. Pertinent negatives include no chest pain, fever or vomiting. Associated symptoms comments: Patient with a history of kidney stones c/o left flank and abdominal pain with hematuria over the last several days. "Feels like when I had stones in the past." He reports no interventions required to pass previous stones. No fever. Nausea without vomiting. No change in bowel habits. .    Past Medical History  Diagnosis Date  . Diabetes mellitus   . Hypertension   . Sleep apnea     Past Surgical History  Procedure Date  . Rotator cuff repair   . Knee arthroscopy   . Tonsillectomy and adenoidectomy     History reviewed. No pertinent family history.  History  Substance Use Topics  . Smoking status: Never Smoker   . Smokeless tobacco: Not on file  . Alcohol Use: No      Review of Systems  Constitutional: Negative.  Negative for fever.  Respiratory: Negative.  Negative for shortness of breath.   Cardiovascular: Negative.  Negative for chest pain.  Gastrointestinal: Positive for nausea and abdominal pain. Negative for vomiting.  Genitourinary: Positive for hematuria and flank pain.    Allergies  Shellfish allergy  Home Medications   Current Outpatient Rx  Name Route Sig Dispense Refill  . FEXOFENADINE-PSEUDOEPHED ER 180-240 MG PO TB24 Oral Take 1 tablet by mouth daily.    Marland Kitchen METFORMIN HCL 500 MG PO  TABS Oral Take 500 mg by mouth daily with breakfast.    . VALSARTAN 160 MG PO TABS Oral Take 160 mg by mouth daily.      BP 149/100  Pulse 84  Temp 98 F (36.7 C)  Resp 18  SpO2 97%  Physical Exam  Constitutional: He appears well-developed and well-nourished. No distress.  Abdominal: Soft. There is tenderness.       Mild LLQ abdominal tenderness without rebound or guarding. Obese abdomen.   Genitourinary:       Left CVA tenderness that is mild.    ED Course  Procedures (including critical care time)   Labs Reviewed  URINALYSIS, ROUTINE W REFLEX MICROSCOPIC   Results for orders placed during the hospital encounter of 06/12/11  URINALYSIS, ROUTINE W REFLEX MICROSCOPIC      Component Value Range   Color, Urine YELLOW  YELLOW    APPearance CLEAR  CLEAR    Specific Gravity, Urine 1.029  1.005 - 1.030    pH 5.5  5.0 - 8.0    Glucose, UA NEGATIVE  NEGATIVE (mg/dL)   Hgb urine dipstick LARGE (*) NEGATIVE    Bilirubin Urine NEGATIVE  NEGATIVE    Ketones, ur NEGATIVE  NEGATIVE (mg/dL)   Protein, ur NEGATIVE  NEGATIVE (mg/dL)   Urobilinogen, UA 0.2  0.0 - 1.0 (mg/dL)   Nitrite NEGATIVE  NEGATIVE    Leukocytes, UA SMALL (*) NEGATIVE   POCT I-STAT, CHEM 8      Component Value Range   Sodium  140  135 - 145 (mEq/L)   Potassium 4.4  3.5 - 5.1 (mEq/L)   Chloride 103  96 - 112 (mEq/L)   BUN 24 (*) 6 - 23 (mg/dL)   Creatinine, Ser 1.61 (*) 0.50 - 1.35 (mg/dL)   Glucose, Bld 096 (*) 70 - 99 (mg/dL)   Calcium, Ion 0.45  4.09 - 1.32 (mmol/L)   TCO2 29  0 - 100 (mmol/L)   Hemoglobin 15.6  13.0 - 17.0 (g/dL)   HCT 81.1  91.4 - 78.2 (%)  URINE MICROSCOPIC-ADD ON      Component Value Range   Squamous Epithelial / LPF RARE  RARE    WBC, UA 3-6  <3 (WBC/hpf)   RBC / HPF 11-20  <3 (RBC/hpf)   Bacteria, UA RARE  RARE    Crystals URIC ACID CRYSTALS (*) NEGATIVE     No results found.   No diagnosis found. 1. Kidney stone 2. Hypertension 3. Renal insufficiency    MDM  Patient  with left flank radiating into LLQ pain similar to documented previous h/o of kidney stones. He initially reported difficulty urinating but has been able to urinate here without difficulty. Nausea, no vomiting, no fever. Positive hematuria today. Will treat for stones without imaging today and instruct as to what symptoms should prompt return to ED. Discussed mildly worsening renal function that should be rechecked with his doctor next week.         Rodena Medin, PA-C 06/12/11 1216

## 2012-09-10 ENCOUNTER — Emergency Department (HOSPITAL_BASED_OUTPATIENT_CLINIC_OR_DEPARTMENT_OTHER)
Admission: EM | Admit: 2012-09-10 | Discharge: 2012-09-10 | Disposition: A | Discharge status: Home / Self Care | Payer: BC Managed Care – PPO | Attending: Emergency Medicine | Admitting: Emergency Medicine

## 2012-09-10 ENCOUNTER — Encounter (HOSPITAL_BASED_OUTPATIENT_CLINIC_OR_DEPARTMENT_OTHER): Payer: Self-pay | Admitting: *Deleted

## 2012-09-10 ENCOUNTER — Emergency Department (HOSPITAL_BASED_OUTPATIENT_CLINIC_OR_DEPARTMENT_OTHER): Payer: BC Managed Care – PPO

## 2012-09-10 DIAGNOSIS — N390 Urinary tract infection, site not specified: Secondary | ICD-10-CM | POA: Insufficient documentation

## 2012-09-10 DIAGNOSIS — E119 Type 2 diabetes mellitus without complications: Secondary | ICD-10-CM | POA: Insufficient documentation

## 2012-09-10 DIAGNOSIS — Z79899 Other long term (current) drug therapy: Secondary | ICD-10-CM | POA: Insufficient documentation

## 2012-09-10 DIAGNOSIS — Z87448 Personal history of other diseases of urinary system: Secondary | ICD-10-CM | POA: Insufficient documentation

## 2012-09-10 DIAGNOSIS — I10 Essential (primary) hypertension: Secondary | ICD-10-CM | POA: Insufficient documentation

## 2012-09-10 DIAGNOSIS — R109 Unspecified abdominal pain: Secondary | ICD-10-CM | POA: Insufficient documentation

## 2012-09-10 DIAGNOSIS — Z87442 Personal history of urinary calculi: Secondary | ICD-10-CM | POA: Insufficient documentation

## 2012-09-10 HISTORY — DX: Disorder of kidney and ureter, unspecified: N28.9

## 2012-09-10 LAB — URINALYSIS, ROUTINE W REFLEX MICROSCOPIC
Bilirubin Urine: NEGATIVE
Ketones, ur: 15 mg/dL — AB
Nitrite: NEGATIVE
Protein, ur: 30 mg/dL — AB
Specific Gravity, Urine: 1.023 (ref 1.005–1.030)
Urobilinogen, UA: 1 mg/dL (ref 0.0–1.0)

## 2012-09-10 MED ORDER — CIPROFLOXACIN HCL 500 MG PO TABS: 500.0000 mg | ORAL_TABLET | Freq: Two times a day (BID) | ORAL | Status: DC

## 2012-09-10 MED ORDER — HYDROCODONE-ACETAMINOPHEN 5-325 MG PO TABS: 2.0000 | ORAL_TABLET | ORAL | Status: DC | PRN

## 2012-09-10 MED ORDER — PROMETHAZINE HCL 25 MG/ML IJ SOLN
25.0000 mg | Freq: Once | INTRAMUSCULAR | Status: AC
  Administered 2012-09-10: 25 mg via INTRAMUSCULAR
  Filled 2012-09-10: qty 1

## 2012-09-10 MED ORDER — KETOROLAC TROMETHAMINE 60 MG/2ML IM SOLN
60.0000 mg | Freq: Once | INTRAMUSCULAR | Status: AC
  Administered 2012-09-10: 60 mg via INTRAMUSCULAR
  Filled 2012-09-10: qty 2

## 2012-09-10 NOTE — ED Provider Notes (Signed)
CSN: [id number]     Arrival date & time 09/10/12  1531 History     First MD Initiated Contact with Patient 09/10/12 1613     Chief Complaint  Patient presents with  . Hematuria   (Consider location/radiation/quality/duration/timing/severity/associated sxs/prior Treatment) HPI Comments: The patient presents with complaints of discomfort in the left flank it started yesterday. This afternoon he began to notice voiding his urine. He denies any fevers or dysuria. There are no bowel complaints. He does have a history of kidney stones but is not any issues with these in nearly a year. He is unable to tell me this feels similar to prior stones.  Patient is a 42 y.o. male presenting with hematuria. The history is provided by the patient.  Hematuria This is a new problem. Episode onset: left flank pain. The problem occurs constantly. The problem has been gradually worsening. Pertinent negatives include no abdominal pain. Nothing aggravates the symptoms. Nothing relieves the symptoms. He has tried nothing for the symptoms. The treatment provided no relief.    Past Medical History  Diagnosis Date  . Diabetes mellitus   . Hypertension   . Sleep apnea   . Renal disorder    Past Surgical History  Procedure Laterality Date  . Rotator cuff repair    . Knee arthroscopy    . Tonsillectomy and adenoidectomy     No family history on file. History  Substance Use Topics  . Smoking status: Never Smoker   . Smokeless tobacco: Not on file  . Alcohol Use: No    Review of Systems  Gastrointestinal: Negative for abdominal pain.  Genitourinary: Positive for hematuria.  All other systems reviewed and are negative.    Allergies  Shellfish allergy  Home Medications   Current Outpatient Rx  Name  Route  Sig  Dispense  Refill  . fexofenadine-pseudoephedrine (ALLEGRA-D 24) 180-240 MG per 24 hr tablet   Oral   Take 1 tablet by mouth daily.         . metFORMIN (GLUCOPHAGE) 500 MG tablet    Oral   Take 500 mg by mouth daily with breakfast.         . valsartan (DIOVAN) 160 MG tablet   Oral   Take 160 mg by mouth daily.          BP 143/101  Pulse 98  Temp(Src) 98.8 F (37.1 C) (Oral)  Resp 18  Ht 6\' 3"  (1.905 m)  Wt 364 lb (165.109 kg)  BMI 45.5 kg/m2  SpO2 98% Physical Exam  Nursing note and vitals reviewed. Constitutional: He is oriented to person, place, and time. He appears well-developed and well-nourished. No distress.  HENT:  Head: Atraumatic.  Mouth/Throat: Oropharynx is clear and moist.  Neck: Normal range of motion. Neck supple.  Cardiovascular: Normal rate, regular rhythm and normal heart sounds.   Pulmonary/Chest: Effort normal and breath sounds normal. No respiratory distress. He has no wheezes.  Abdominal: Soft. Bowel sounds are normal. He exhibits no distension. There is no tenderness.  There is mild left-sided flank tenderness. There is no abdominal tenderness to palpation  Musculoskeletal: Normal range of motion. He exhibits no edema.  Neurological: He is alert and oriented to person, place, and time.  Skin: Skin is warm and dry. He is not diaphoretic.    ED Course   Procedures (including critical care time)  Labs Reviewed  URINALYSIS, ROUTINE W REFLEX MICROSCOPIC   No results found. No diagnosis found.  MDM  The patient presents  here with complaints consistent with a kidney stone. Urinalysis revealed a blood and leukocytes. He underwent a CAT scan due to his history of renal calculi which was negative for obstructing stone. He did have a nonobstructing calculus within the renal lower pole. He was given Toradol and Phenergan and is feeling better. He will be discharged with Cipro and pain medication. He is to return for fevers vomiting or if he develops new concerning symptoms.  Geoffery Lyons, MD 09/10/12 1739

## 2012-09-10 NOTE — ED Notes (Signed)
Lower back pain and hematuria x 1 hour. Hx kidney stones.

## 2012-09-12 LAB — URINE CULTURE
Colony Count: NO GROWTH
Culture: NO GROWTH

## 2012-12-25 ENCOUNTER — Other Ambulatory Visit (HOSPITAL_BASED_OUTPATIENT_CLINIC_OR_DEPARTMENT_OTHER): Payer: Self-pay | Admitting: Internal Medicine

## 2012-12-25 DIAGNOSIS — R109 Unspecified abdominal pain: Secondary | ICD-10-CM

## 2012-12-26 ENCOUNTER — Ambulatory Visit (HOSPITAL_BASED_OUTPATIENT_CLINIC_OR_DEPARTMENT_OTHER)
Admission: RE | Admit: 2012-12-26 | Discharge: 2012-12-26 | Disposition: A | Discharge status: Home / Self Care | Payer: BC Managed Care – PPO | Source: Ambulatory Visit | Attending: Internal Medicine | Admitting: Internal Medicine

## 2012-12-26 DIAGNOSIS — K7689 Other specified diseases of liver: Secondary | ICD-10-CM | POA: Insufficient documentation

## 2012-12-26 DIAGNOSIS — N1330 Unspecified hydronephrosis: Secondary | ICD-10-CM | POA: Insufficient documentation

## 2012-12-26 DIAGNOSIS — R109 Unspecified abdominal pain: Secondary | ICD-10-CM

## 2012-12-26 DIAGNOSIS — R599 Enlarged lymph nodes, unspecified: Secondary | ICD-10-CM | POA: Insufficient documentation

## 2012-12-26 DIAGNOSIS — N201 Calculus of ureter: Secondary | ICD-10-CM | POA: Insufficient documentation

## 2018-03-12 ENCOUNTER — Other Ambulatory Visit: Payer: Self-pay

## 2018-03-12 ENCOUNTER — Encounter (HOSPITAL_COMMUNITY): Payer: Self-pay

## 2018-03-12 ENCOUNTER — Ambulatory Visit (HOSPITAL_COMMUNITY)
Admission: EM | Admit: 2018-03-12 | Discharge: 2018-03-12 | Disposition: A | Discharge status: Home / Self Care | Payer: 59 | Attending: Internal Medicine | Admitting: Internal Medicine

## 2018-03-12 DIAGNOSIS — S86911A Strain of unspecified muscle(s) and tendon(s) at lower leg level, right leg, initial encounter: Secondary | ICD-10-CM | POA: Insufficient documentation

## 2018-03-12 NOTE — ED Triage Notes (Signed)
Pt cc states she was having some back issues and he was favoring the right knee more then the left.pt now has right knee pain.

## 2018-03-12 NOTE — ED Provider Notes (Signed)
[facility]    CSN: [id number] Arrival date & time: 03/12/18  1739     History   Chief Complaint No chief complaint on file.   HPI Edgar Kidd is a 48 y.o. male. who present with flair of R knee pain since he has been dealing with back pain and been waking different. Pain onset 2 days ago. 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 He denies an injury. Has never had surgery on this knee, only the L one to clean the meniscus.  He cant tell if there is redness or redness of the R knee. Has taken Naproxen 600 mg qd. Pain is worse with pivoting and has noticed a clicking in it.  Has past hx of R knee problems and MRI of 05/2010 showed:  Extensive complex tear of the posterior horn through midportion of the medial meniscus. Pronounced medial compartment chondromalacia.  Minimal cartilage defect along the lateral tibial plateau.  Mild chondromalacia at the femoral trochlea. Past Medical History:  Diagnosis Date  . Diabetes mellitus   . Hypertension   . Renal disorder   . Sleep apnea     There are no active problems to display for this patient.   Past Surgical History:  Procedure Laterality Date  . KNEE ARTHROSCOPY    . ROTATOR CUFF REPAIR    . TONSILLECTOMY AND ADENOIDECTOMY     Had L knee arthroscopy 7-8 years.     Home Medications    Prior to Admission medications   Medication Sig Start Date End Date Taking? Authorizing Provider  fexofenadine-pseudoephedrine (ALLEGRA-D 24) 180-240 MG per 24 hr tablet Take 1 tablet by mouth daily.    [provider]  metFORMIN (GLUCOPHAGE) 500 MG tablet Take 500 mg by mouth daily with breakfast.    [provider]  valsartan (DIOVAN) 160 MG tablet Take 160 mg by mouth daily.    [provider]    Family History History reviewed. No pertinent family history.  Social History Social History   Tobacco Use  . Smoking status:  Never Smoker  . Smokeless tobacco: Never Used  Substance Use Topics  . Alcohol use: No  . Drug use: No     Allergies   Shellfish allergy   Review of Systems Review of Systems  Constitutional: Negative for fever.  Musculoskeletal: Positive for arthralgias and back pain. Negative for joint swelling.       Back pain is better  Skin: Negative for color change, pallor, rash and wound.  Neurological: Negative for numbness.     Physical Exam Triage Vital Signs ED Triage Vitals  Enc Vitals Group     BP 03/12/18 1809 (!) 144/76     Pulse Rate 03/12/18 1809 87     Resp 03/12/18 1809 18     Temp 03/12/18 1809 97.7 F (36.5 C)     Temp src --      SpO2 03/12/18 1809 99 %     Weight 03/12/18 1807 (!) 396 lb (179.6 kg)     Height --      Head Circumference --      Peak Flow --      Pain Score 03/12/18 1807 8     Pain Loc --      Pain Edu? --      Excl. in GC? --    No data found.  Updated Vital Signs BP Marland Kitchen(!)  144/76 (BP Location: Right Arm)   Pulse 87   Temp 97.7 F (36.5 C)   Resp 18   Wt (!) 396 lb (179.6 kg)   SpO2 99%   BMI 49.50 kg/m   Visual Acuity Right Eye Distance:   Left Eye Distance:   Bilateral Distance:    Right Eye Near:   Left Eye Near:    Bilateral Near:     Physical Exam Vitals signs and nursing note reviewed.  Constitutional:      General: He is not in acute distress.    Appearance: He is obese. He is not toxic-appearing.  HENT:     Head: Normocephalic.     Right Ear: External ear normal.     Left Ear: External ear normal.     Nose: Nose normal.  Eyes:     General: No scleral icterus.    Conjunctiva/sclera: Conjunctivae normal.  Neck:     Musculoskeletal: Neck supple.  Pulmonary:     Effort: Pulmonary effort is normal.  Musculoskeletal:        General: No swelling, deformity or signs of injury.     Comments: Has +1/4 pitting edema of ankles and distal shin.  R KNEE- upon inspection there is not redness, hotness or effusion/  swelling noted. Has local tenderness of L lateral knee which was provoked with testing the lateral collateral and medial collateral ligaments. No laxity noted. Posterior knee reveals no swelling.   Skin:    General: Skin is warm and dry.     Findings: No bruising, erythema, lesion or rash.     Comments: No wounds on R knee noted  Neurological:     Mental Status: He is alert and oriented to person, place, and time.     Motor: Weakness present.     Gait: Gait abnormal.     Comments: Has slight limp  Psychiatric:        Mood and Affect: Mood normal.        Behavior: Behavior normal.        Thought Content: Thought content normal.        Judgment: Judgment normal.    UC Treatments / Results  Labs (all labs ordered are listed, but only abnormal results are displayed) Labs Reviewed - No data to display  EKG None  Radiology No results found.  Procedures Procedures   Medications Ordered in UC Medications - No data to display  Initial Impression / Assessment and Plan / UC Course  I have reviewed the triage vital signs and the nursing notes. I reviewed his 2012 R knee MRI as noted above and reviewed it with pt. I explained to him, I suspect he strained his lateral collateral ligament and perhaps the meniscus he had injured in the past may be worse.  He was placed on a knee brace which he should wear when active( see instructions), and needs to FU with ortho.   Final Clinical Impressions(s) / UC Diagnoses   Final diagnoses:  None   Discharge Instructions   None    ED Prescriptions    None     Controlled Substance Prescriptions [place] Controlled Substance Registry consulted?    Garey HamRodriguez-Southworth, Doranne Schmutz, [place] 03/12/18 1913

## 2018-03-12 NOTE — Discharge Instructions (Addendum)
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 If while sitting it is too tight on the back of your knee, then remove it and put it back on when you are done driving.   Apply ice on area of pain 20 minutes 2-4 times a day for 48h,then you may alternate with heat for a few days.   Follow up with orthopedic Dr in 7-10 days, I've added a name on this page.   You may take Tylenol or Ibuprofen as needed for pain.

## 2018-03-18 ENCOUNTER — Telehealth (HOSPITAL_COMMUNITY): Payer: Self-pay | Admitting: Emergency Medicine

## 2018-03-18 NOTE — Telephone Encounter (Signed)
Pt's partner called stating he could not get in with Orthopedic due to difficulties with one provided on paperwork.  Followed up, made recommendations, and explained follow-up procedure.  Verbalized understanding.

## 2018-04-13 ENCOUNTER — Encounter (HOSPITAL_BASED_OUTPATIENT_CLINIC_OR_DEPARTMENT_OTHER): Payer: Self-pay | Admitting: Emergency Medicine

## 2018-04-13 ENCOUNTER — Emergency Department (HOSPITAL_BASED_OUTPATIENT_CLINIC_OR_DEPARTMENT_OTHER)
Admission: EM | Admit: 2018-04-13 | Discharge: 2018-04-13 | Disposition: A | Discharge status: Home / Self Care | Payer: 59 | Attending: Emergency Medicine | Admitting: Emergency Medicine

## 2018-04-13 ENCOUNTER — Other Ambulatory Visit: Payer: Self-pay

## 2018-04-13 ENCOUNTER — Emergency Department (HOSPITAL_BASED_OUTPATIENT_CLINIC_OR_DEPARTMENT_OTHER): Payer: 59

## 2018-04-13 DIAGNOSIS — R1032 Left lower quadrant pain: Secondary | ICD-10-CM | POA: Diagnosis present

## 2018-04-13 DIAGNOSIS — Z79899 Other long term (current) drug therapy: Secondary | ICD-10-CM | POA: Insufficient documentation

## 2018-04-13 DIAGNOSIS — Z7984 Long term (current) use of oral hypoglycemic drugs: Secondary | ICD-10-CM | POA: Insufficient documentation

## 2018-04-13 DIAGNOSIS — I10 Essential (primary) hypertension: Secondary | ICD-10-CM | POA: Diagnosis not present

## 2018-04-13 DIAGNOSIS — E119 Type 2 diabetes mellitus without complications: Secondary | ICD-10-CM | POA: Insufficient documentation

## 2018-04-13 DIAGNOSIS — N200 Calculus of kidney: Secondary | ICD-10-CM | POA: Insufficient documentation

## 2018-04-13 LAB — CBC WITH DIFFERENTIAL/PLATELET
Abs Immature Granulocytes: 0.02 10*3/uL (ref 0.00–0.07)
BASOS ABS: 0.1 10*3/uL (ref 0.0–0.1)
Basophils Relative: 1 %
Eosinophils Absolute: 0.2 10*3/uL (ref 0.0–0.5)
Eosinophils Relative: 2 %
HEMATOCRIT: 44.6 % (ref 39.0–52.0)
Hemoglobin: 14.1 g/dL (ref 13.0–17.0)
IMMATURE GRANULOCYTES: 0 %
Lymphocytes Relative: 26 %
Lymphs Abs: 2.4 10*3/uL (ref 0.7–4.0)
MCH: 28.4 pg (ref 26.0–34.0)
MCHC: 31.6 g/dL (ref 30.0–36.0)
MCV: 89.7 fL (ref 80.0–100.0)
MONOS PCT: 8 %
Monocytes Absolute: 0.8 10*3/uL (ref 0.1–1.0)
Neutro Abs: 5.8 10*3/uL (ref 1.7–7.7)
Neutrophils Relative %: 63 %
Platelets: 185 10*3/uL (ref 150–400)
RBC: 4.97 MIL/uL (ref 4.22–5.81)
RDW: 13.6 % (ref 11.5–15.5)
WBC: 9.2 10*3/uL (ref 4.0–10.5)
nRBC: 0 % (ref 0.0–0.2)

## 2018-04-13 LAB — BASIC METABOLIC PANEL
Anion gap: 7 (ref 5–15)
BUN: 13 mg/dL (ref 6–20)
CO2: 26 mmol/L (ref 22–32)
Calcium: 9.1 mg/dL (ref 8.9–10.3)
Chloride: 103 mmol/L (ref 98–111)
Creatinine, Ser: 1.17 mg/dL (ref 0.61–1.24)
GFR calc Af Amer: >60 mL/min (ref >60)
GLUCOSE: 105 mg/dL — AB (ref 70–99)
Potassium: 3.8 mmol/L (ref 3.5–5.1)
Sodium: 136 mmol/L (ref 135–145)

## 2018-04-13 LAB — URINALYSIS, ROUTINE W REFLEX MICROSCOPIC
Bilirubin Urine: NEGATIVE
GLUCOSE, UA: NEGATIVE mg/dL
KETONES UR: NEGATIVE mg/dL
Nitrite: NEGATIVE
PH: 5.5 (ref 5.0–8.0)
Protein, ur: 100 mg/dL — AB
Specific Gravity, Urine: >1.03 — ABNORMAL HIGH (ref 1.005–1.030)

## 2018-04-13 LAB — URINALYSIS, MICROSCOPIC (REFLEX): RBC / HPF: >50 RBC/hpf (ref 0–5)

## 2018-04-13 MED ORDER — MORPHINE SULFATE (PF) 4 MG/ML IV SOLN
4.0000 mg | Freq: Once | INTRAVENOUS | Status: AC
  Administered 2018-04-13: 4 mg via INTRAVENOUS
  Filled 2018-04-13: qty 1

## 2018-04-13 MED ORDER — TAMSULOSIN HCL 0.4 MG PO CAPS: 0.4000 mg | ORAL_CAPSULE | Freq: Every day | ORAL | 0 refills | Status: DC

## 2018-04-13 MED ORDER — OXYCODONE-ACETAMINOPHEN 5-325 MG PO TABS: 1.0000 | ORAL_TABLET | Freq: Four times a day (QID) | ORAL | 0 refills | Status: DC | PRN

## 2018-04-13 MED ORDER — ONDANSETRON HCL 4 MG/2ML IJ SOLN
4.0000 mg | Freq: Once | INTRAMUSCULAR | Status: AC
  Administered 2018-04-13: 4 mg via INTRAVENOUS
  Filled 2018-04-13: qty 2

## 2018-04-13 MED ORDER — KETOROLAC TROMETHAMINE 30 MG/ML IJ SOLN
30.0000 mg | Freq: Once | INTRAMUSCULAR | Status: AC
  Administered 2018-04-13: 30 mg via INTRAVENOUS
  Filled 2018-04-13: qty 1

## 2018-04-13 NOTE — ED Notes (Signed)
Pt unable to give urine sample at this time 

## 2018-04-13 NOTE — ED Triage Notes (Signed)
Reports flank pain, dysuria and nausea x 2 days.  History of kidney stones.

## 2018-04-13 NOTE — ED Provider Notes (Signed)
[facility] EMERGENCY DEPARTMENT Provider Note   CSN: [id number] Arrival date & time: 04/13/18  1164    History   Chief Complaint Chief Complaint  Patient presents with  . Flank Pain    HPI Edgar Kidd is a 48 y.o. male.     Patient is a 48 year old male with past medical history of diabetes, hypertension, and kidney stones.  He presents today for evaluation of severe left flank pain.  This began yesterday and is worsening.  This pain was preceded by blood in the urine.  He denies any fevers or chills.  He denies any abdominal or bowel complaints.  This feels similar to prior renal calculi.  The history is provided by the patient.  Flank Pain  This is a new problem. The current episode started yesterday. The problem occurs constantly. The problem has been rapidly worsening. Nothing aggravates the symptoms. Nothing relieves the symptoms. He has tried nothing for the symptoms.    Past Medical History:  Diagnosis Date  . Diabetes mellitus   . Hypertension   . Renal disorder   . Sleep apnea     There are no active problems to display for this patient.   Past Surgical History:  Procedure Laterality Date  . KNEE ARTHROSCOPY    . ROTATOR CUFF REPAIR    . TONSILLECTOMY AND ADENOIDECTOMY          Home Medications    Prior to Admission medications   Medication Sig Start Date End Date Taking? Authorizing Provider  fexofenadine-pseudoephedrine (ALLEGRA-D 24) 180-240 MG per 24 hr tablet Take 1 tablet by mouth daily.    [provider]  metFORMIN (GLUCOPHAGE) 500 MG tablet Take 500 mg by mouth daily with breakfast.    [provider]  valsartan (DIOVAN) 160 MG tablet Take 160 mg by mouth daily.    [provider]    Family History History reviewed. No pertinent family history.  Social History Social History   Tobacco Use  . Smoking status: Never Smoker  . Smokeless tobacco: Never Used  Substance Use Topics  . Alcohol use:  No  . Drug use: No     Allergies   Shellfish allergy   Review of Systems Review of Systems  Genitourinary: Positive for flank pain.  All other systems reviewed and are negative.    Physical Exam Updated Vital Signs BP (!) 154/95 (BP Location: Left Arm)   Pulse 94   Temp 98.8 F (37.1 C) (Oral)   Ht 6\' 3"  (1.905 m)   Wt (!) 175.5 kg   SpO2 97%   BMI 48.37 kg/m   Physical Exam Vitals signs and nursing note reviewed.  Constitutional:      General: He is not in acute distress.    Appearance: He is well-developed. He is not diaphoretic.  HENT:     Head: Normocephalic and atraumatic.  Neck:     Musculoskeletal: Normal range of motion and neck supple.  Cardiovascular:     Rate and Rhythm: Normal rate and regular rhythm.     Heart sounds: No murmur. No friction rub.  Pulmonary:     Effort: Pulmonary effort is normal. No respiratory distress.     Breath sounds: Normal breath sounds. No wheezing or rales.  Abdominal:     General: Bowel sounds are normal. There is no distension.     Palpations: Abdomen is soft.     Tenderness: There is no abdominal tenderness. There is left CVA tenderness.  Musculoskeletal:  Normal range of motion.  Skin:    General: Skin is warm and dry.  Neurological:     Mental Status: He is alert and oriented to person, place, and time.     Coordination: Coordination normal.      ED Treatments / Results  Labs (all labs ordered are listed, but only abnormal results are displayed) Labs Reviewed  URINALYSIS, ROUTINE W REFLEX MICROSCOPIC    EKG None  Radiology No results found.  Procedures Procedures (including critical care time)  Medications Ordered in ED Medications  ondansetron (ZOFRAN) injection 4 mg (has no administration in time range)  morphine 4 MG/ML injection 4 mg (has no administration in time range)  ketorolac (TORADOL) 30 MG/ML injection 30 mg (has no administration in time range)     Initial Impression / Assessment  and Plan / ED Course  I have reviewed the triage vital signs and the nursing notes.  Pertinent labs & imaging results that were available during my care of the patient were reviewed by me and considered in my medical decision making (see chart for details).  Patient presenting with complaints of left flank pain.  He has a history of renal calculi and today's CT scan shows a 7 x 11 mm stone in the mid left ureter.  I feel as though this is unlikely to pass spontaneously and will likely need urologic intervention.  Patient will be given Percocet and Flomax.  He is to follow-up with urology in the very near future.  Final Clinical Impressions(s) / ED Diagnoses   Final diagnoses:  None    ED Discharge Orders    None       Geoffery Lyons, MD 04/13/18 2148

## 2018-04-13 NOTE — ED Notes (Signed)
Patient transported to CT 

## 2018-04-13 NOTE — ED Notes (Signed)
ED Provider at bedside. 

## 2018-04-13 NOTE — Discharge Instructions (Addendum)
Percocet as prescribed as needed for pain.  Begin taking Flomax daily as prescribed today.  Follow-up with urology in the next 2 to 3 days.  The contact information for [facility] has been provided in this discharge summary for you to call and make these arrangements.  Return to the emergency department in the meantime if you develop worsening pain, high fever, or other new and concerning symptoms.

## 2018-04-16 ENCOUNTER — Other Ambulatory Visit: Payer: Self-pay | Admitting: Urology

## 2018-04-20 ENCOUNTER — Encounter (HOSPITAL_COMMUNITY): Payer: Self-pay

## 2018-04-20 ENCOUNTER — Encounter (INDEPENDENT_AMBULATORY_CARE_PROVIDER_SITE_OTHER): Payer: Self-pay

## 2018-04-20 ENCOUNTER — Other Ambulatory Visit: Payer: Self-pay

## 2018-04-20 ENCOUNTER — Encounter (HOSPITAL_COMMUNITY)
Admission: RE | Admit: 2018-04-20 | Discharge: 2018-04-20 | Disposition: A | Discharge status: Home / Self Care | Payer: 59 | Source: Ambulatory Visit | Attending: Urology | Admitting: Urology

## 2018-04-20 DIAGNOSIS — N132 Hydronephrosis with renal and ureteral calculous obstruction: Secondary | ICD-10-CM | POA: Diagnosis not present

## 2018-04-20 DIAGNOSIS — Z01812 Encounter for preprocedural laboratory examination: Secondary | ICD-10-CM

## 2018-04-20 DIAGNOSIS — I10 Essential (primary) hypertension: Secondary | ICD-10-CM | POA: Diagnosis not present

## 2018-04-20 DIAGNOSIS — G4730 Sleep apnea, unspecified: Secondary | ICD-10-CM | POA: Diagnosis not present

## 2018-04-20 DIAGNOSIS — Z6841 Body Mass Index (BMI) 40.0 and over, adult: Secondary | ICD-10-CM | POA: Diagnosis not present

## 2018-04-20 DIAGNOSIS — Z87442 Personal history of urinary calculi: Secondary | ICD-10-CM | POA: Diagnosis not present

## 2018-04-20 DIAGNOSIS — E119 Type 2 diabetes mellitus without complications: Secondary | ICD-10-CM | POA: Diagnosis not present

## 2018-04-20 HISTORY — DX: Personal history of urinary calculi: Z87.442

## 2018-04-20 LAB — HEMOGLOBIN A1C
Hgb A1c MFr Bld: 7.3 % — ABNORMAL HIGH (ref 4.8–5.6)
MEAN PLASMA GLUCOSE: 162.81 mg/dL

## 2018-04-20 LAB — GLUCOSE, CAPILLARY: GLUCOSE-CAPILLARY: 87 mg/dL (ref 70–99)

## 2018-04-20 NOTE — Progress Notes (Signed)
04/13/2018- noted in Epic- Labs-CBC w/diff., BMP, U/A and micro U/A.

## 2018-04-20 NOTE — Patient Instructions (Addendum)
[facility]  04/20/2018   Your procedure is scheduled on: Wednesday 04/22/2018  Report to [facility] Main  Entrance              Report to admitting at 300 PM    Call this number if you have problems the morning of surgery [phone]              Please bring CPAP mask and tubing with you to the hospital the morning of surgery!   Remember: Do not eat food :After Midnight. May have clear liquids from midnight up until 1100 am then nothing until after surgery!    CLEAR LIQUID DIET   Foods Allowed                                                                     Foods Excluded  Coffee and tea, regular and decaf                             liquids that you cannot  Plain Jell-O in any flavor                                             see through such as: Fruit ices (not with fruit pulp)                                     milk, soups, orange juice  Iced Popsicles                                    All solid food Carbonated beverages, regular and diet                                    Cranberry, grape and apple juices Sports drinks like Gatorade Lightly seasoned clear broth or consume(fat free) Sugar, honey syrup  Sample Menu Breakfast                                Lunch                                     Supper Cranberry juice                    Beef broth                            Chicken broth Jell-O  Grape juice                           Apple juice Coffee or tea                        Jell-O                                      Popsicle                                                Coffee or tea                        Coffee or tea  _____________________________________________________________________               BRUSH YOUR TEETH MORNING OF SURGERY AND RINSE YOUR MOUTH OUT, NO CHEWING GUM CANDY OR MINTS.     Take these medicines the morning of surgery with A SIP OF WATER: Tamulosin (Flomax)                DO NOT TAKE ANY DIABETIC MEDICATIONS DAY OF YOUR SURGERY!                               You may not have any metal on your body including hair pins and              piercings  Do not wear jewelry, make-up, lotions, powders or perfumes, deodorant             Men may shave face and neck.   Do not bring valuables to the hospital. [place] IS NOT             RESPONSIBLE   FOR VALUABLES.  Contacts, dentures or bridgework may not be worn into surgery.  Leave suitcase in the car. After surgery it may be brought to your room.     Patients discharged the day of surgery will not be allowed to drive home. IF YOU ARE HAVING SURGERY AND GOING HOME THE SAME DAY, YOU MUST HAVE AN ADULT TO DRIVE YOU HOME AND BE WITH YOU FOR 24 HOURS. YOU MAY GO HOME BY TAXI OR UBER OR ORTHERWISE, BUT AN ADULT MUST ACCOMPANY YOU HOME AND STAY WITH YOU FOR 24 HOURS.  Name and phone number of your driver:                Please read over the following fact sheets you were given: _____________________________________________________________________             [facility] Before surgery, you can play an important role.  Because skin is not sterile, your skin needs to be as free of germs as possible.  You can reduce the number of germs on your skin by washing with CHG (chlorahexidine gluconate) soap before surgery.  CHG is an antiseptic cleaner which kills germs and bonds with the skin to continue killing germs even after washing. Please DO NOT use if you have an allergy to CHG or antibacterial soaps.  If your skin becomes reddened/irritated stop using the CHG and  inform your nurse when you arrive at Short Stay. Do not shave (including legs and underarms) for at least 48 hours prior to the first CHG shower.  You may shave your face/neck. Please follow these instructions carefully:  1.  Shower with CHG Soap the night before surgery and the  morning of Surgery.  2.  If you choose to wash  your hair, wash your hair first as usual with your  normal  shampoo.  3.  After you shampoo, rinse your hair and body thoroughly to remove the  shampoo.                           4.  Use CHG as you would any other liquid soap.  You can apply chg directly  to the skin and wash                       Gently with a scrungie or clean washcloth.  5.  Apply the CHG Soap to your body ONLY FROM THE NECK DOWN.   Do not use on face/ open                           Wound or open sores. Avoid contact with eyes, ears mouth and genitals (private parts).                       Wash face,  Genitals (private parts) with your normal soap.             6.  Wash thoroughly, paying special attention to the area where your surgery  will be performed.  7.  Thoroughly rinse your body with warm water from the neck down.  8.  DO NOT shower/wash with your normal soap after using and rinsing off  the CHG Soap.                9.  Pat yourself dry with a clean towel.            10.  Wear clean pajamas.            11.  Place clean sheets on your bed the night of your first shower and do not  sleep with pets. Day of Surgery : Do not apply any lotions/deodorants the morning of surgery.  Please wear clean clothes to the hospital/surgery center.  FAILURE TO FOLLOW THESE INSTRUCTIONS MAY RESULT IN THE CANCELLATION OF YOUR SURGERY PATIENT SIGNATURE_________________________________  NURSE SIGNATURE__________________________________  ________________________________________________________________________

## 2018-04-21 MED ORDER — GENTAMICIN SULFATE 40 MG/ML IJ SOLN
5.0000 mg/kg | INTRAVENOUS | Status: AC
  Administered 2018-04-22: 584 mg via INTRAVENOUS
  Filled 2018-04-21: qty 14.5

## 2018-04-22 ENCOUNTER — Ambulatory Visit (HOSPITAL_COMMUNITY): Payer: 59

## 2018-04-22 ENCOUNTER — Ambulatory Visit (HOSPITAL_COMMUNITY)
Admission: RE | Admit: 2018-04-22 | Discharge: 2018-04-22 | Disposition: A | Discharge status: Home / Self Care | Payer: 59 | Attending: Urology | Admitting: Urology

## 2018-04-22 ENCOUNTER — Encounter (HOSPITAL_COMMUNITY)
Admission: RE | Disposition: A | Discharge status: Home / Self Care | Payer: Self-pay | Source: Home / Self Care | Attending: Urology

## 2018-04-22 ENCOUNTER — Encounter (HOSPITAL_COMMUNITY): Payer: Self-pay | Admitting: *Deleted

## 2018-04-22 ENCOUNTER — Ambulatory Visit (HOSPITAL_COMMUNITY): Payer: 59 | Admitting: Certified Registered Nurse Anesthetist

## 2018-04-22 DIAGNOSIS — N132 Hydronephrosis with renal and ureteral calculous obstruction: Secondary | ICD-10-CM | POA: Diagnosis not present

## 2018-04-22 DIAGNOSIS — Z87442 Personal history of urinary calculi: Secondary | ICD-10-CM | POA: Insufficient documentation

## 2018-04-22 DIAGNOSIS — I10 Essential (primary) hypertension: Secondary | ICD-10-CM | POA: Insufficient documentation

## 2018-04-22 DIAGNOSIS — G4730 Sleep apnea, unspecified: Secondary | ICD-10-CM | POA: Insufficient documentation

## 2018-04-22 DIAGNOSIS — E119 Type 2 diabetes mellitus without complications: Secondary | ICD-10-CM | POA: Insufficient documentation

## 2018-04-22 DIAGNOSIS — Z6841 Body Mass Index (BMI) 40.0 and over, adult: Secondary | ICD-10-CM | POA: Insufficient documentation

## 2018-04-22 HISTORY — PX: HOLMIUM LASER APPLICATION: SHX5852

## 2018-04-22 HISTORY — PX: CYSTOSCOPY WITH RETROGRADE PYELOGRAM, URETEROSCOPY AND STENT PLACEMENT: SHX5789

## 2018-04-22 LAB — GLUCOSE, CAPILLARY
Glucose-Capillary: 106 mg/dL — ABNORMAL HIGH (ref 70–99)
Glucose-Capillary: 129 mg/dL — ABNORMAL HIGH (ref 70–99)

## 2018-04-22 SURGERY — CYSTOURETEROSCOPY, WITH RETROGRADE PYELOGRAM AND STENT INSERTION
Anesthesia: General | Laterality: Left

## 2018-04-22 MED ORDER — LIDOCAINE 2% (20 MG/ML) 5 ML SYRINGE
INTRAMUSCULAR | Status: DC | PRN
  Administered 2018-04-22: 60 mg via INTRAVENOUS

## 2018-04-22 MED ORDER — FENTANYL CITRATE (PF) 100 MCG/2ML IJ SOLN
INTRAMUSCULAR | Status: DC | PRN
  Administered 2018-04-22 (×3): 50 ug via INTRAVENOUS

## 2018-04-22 MED ORDER — PROPOFOL 10 MG/ML IV BOLUS
INTRAVENOUS | Status: AC
  Filled 2018-04-22: qty 20

## 2018-04-22 MED ORDER — SODIUM CHLORIDE 0.9 % IR SOLN
Status: DC | PRN
  Administered 2018-04-22: 3000 mL

## 2018-04-22 MED ORDER — DEXAMETHASONE SODIUM PHOSPHATE 4 MG/ML IJ SOLN
INTRAMUSCULAR | Status: DC | PRN
  Administered 2018-04-22: 10 mg via INTRAVENOUS

## 2018-04-22 MED ORDER — PROMETHAZINE HCL 25 MG/ML IJ SOLN: 6.2500 mg | INTRAMUSCULAR | Status: DC | PRN

## 2018-04-22 MED ORDER — PROPOFOL 10 MG/ML IV BOLUS
INTRAVENOUS | Status: DC | PRN
  Administered 2018-04-22: 300 mg via INTRAVENOUS

## 2018-04-22 MED ORDER — SODIUM CHLORIDE 0.9 % IV SOLN
INTRAVENOUS | Status: DC | PRN
  Administered 2018-04-22: 31 mL

## 2018-04-22 MED ORDER — FENTANYL CITRATE (PF) 100 MCG/2ML IJ SOLN
INTRAMUSCULAR | Status: AC
  Filled 2018-04-22: qty 2

## 2018-04-22 MED ORDER — FENTANYL CITRATE (PF) 100 MCG/2ML IJ SOLN: 25.0000 ug | INTRAMUSCULAR | Status: DC | PRN

## 2018-04-22 MED ORDER — ONDANSETRON HCL 4 MG/2ML IJ SOLN
INTRAMUSCULAR | Status: AC
  Filled 2018-04-22: qty 2

## 2018-04-22 MED ORDER — MIDAZOLAM HCL 2 MG/2ML IJ SOLN
INTRAMUSCULAR | Status: AC
  Filled 2018-04-22: qty 2

## 2018-04-22 MED ORDER — ONDANSETRON HCL 4 MG/2ML IJ SOLN
INTRAMUSCULAR | Status: DC | PRN
  Administered 2018-04-22: 4 mg via INTRAVENOUS

## 2018-04-22 MED ORDER — SENNOSIDES-DOCUSATE SODIUM 8.6-50 MG PO TABS: 1.0000 | ORAL_TABLET | Freq: Two times a day (BID) | ORAL | 0 refills | Status: DC

## 2018-04-22 MED ORDER — GLYCOPYRROLATE PF 0.2 MG/ML IJ SOSY
PREFILLED_SYRINGE | INTRAMUSCULAR | Status: AC
  Filled 2018-04-22: qty 1

## 2018-04-22 MED ORDER — LIDOCAINE 2% (20 MG/ML) 5 ML SYRINGE
INTRAMUSCULAR | Status: AC
  Filled 2018-04-22: qty 5

## 2018-04-22 MED ORDER — MIDAZOLAM HCL 5 MG/5ML IJ SOLN
INTRAMUSCULAR | Status: DC | PRN
  Administered 2018-04-22: 2 mg via INTRAVENOUS

## 2018-04-22 MED ORDER — LACTATED RINGERS IV SOLN
INTRAVENOUS | Status: DC
  Administered 2018-04-22: 16:00:00 via INTRAVENOUS

## 2018-04-22 MED ORDER — GLYCOPYRROLATE PF 0.2 MG/ML IJ SOSY
PREFILLED_SYRINGE | INTRAMUSCULAR | Status: DC | PRN
  Administered 2018-04-22: .2 mg via INTRAVENOUS

## 2018-04-22 MED ORDER — OXYCODONE-ACETAMINOPHEN 5-325 MG PO TABS: 1.0000 | ORAL_TABLET | Freq: Four times a day (QID) | ORAL | 0 refills | Status: DC | PRN

## 2018-04-22 MED ORDER — CEPHALEXIN 500 MG PO CAPS: 500.0000 mg | ORAL_CAPSULE | Freq: Two times a day (BID) | ORAL | 0 refills | Status: AC

## 2018-04-22 MED ORDER — DEXAMETHASONE SODIUM PHOSPHATE 10 MG/ML IJ SOLN
INTRAMUSCULAR | Status: AC
  Filled 2018-04-22: qty 1

## 2018-04-22 MED ORDER — ACETAMINOPHEN 500 MG PO TABS
1000.0000 mg | ORAL_TABLET | Freq: Once | ORAL | Status: AC
  Administered 2018-04-22: 1000 mg via ORAL
  Filled 2018-04-22: qty 2

## 2018-04-22 SURGICAL SUPPLY — 28 items
BAG URO CATCHER STRL LF (MISCELLANEOUS) ×3 IMPLANT
BASKET LASER NITINOL 1.9FR (BASKET) ×2 IMPLANT
BSKT STON RTRVL 120 1.9FR (BASKET) ×1
CATH INTERMIT  6FR 70CM (CATHETERS) ×3 IMPLANT
CLOTH BEACON ORANGE TIMEOUT ST (SAFETY) ×3 IMPLANT
COVER SURGICAL LIGHT HANDLE (MISCELLANEOUS) ×3 IMPLANT
COVER WAND RF STERILE (DRAPES) IMPLANT
EXTRACTOR STONE 1.7FRX115CM (UROLOGICAL SUPPLIES) IMPLANT
FIBER LASER FLEXIVA 1000 (UROLOGICAL SUPPLIES) IMPLANT
FIBER LASER FLEXIVA 365 (UROLOGICAL SUPPLIES) IMPLANT
FIBER LASER FLEXIVA 550 (UROLOGICAL SUPPLIES) IMPLANT
FIBER LASER TRAC TIP (UROLOGICAL SUPPLIES) ×2 IMPLANT
GLOVE BIOGEL M STRL SZ7.5 (GLOVE) ×3 IMPLANT
GOWN STRL REUS W/TWL LRG LVL3 (GOWN DISPOSABLE) ×6 IMPLANT
GUIDEWIRE ANG ZIPWIRE 038X150 (WIRE) ×3 IMPLANT
GUIDEWIRE STR DUAL SENSOR (WIRE) ×3 IMPLANT
IV NS 1000ML (IV SOLUTION) ×3
IV NS 1000ML BAXH (IV SOLUTION) ×1 IMPLANT
KIT TURNOVER KIT A (KITS) IMPLANT
MANIFOLD NEPTUNE II (INSTRUMENTS) ×3 IMPLANT
PACK CYSTO (CUSTOM PROCEDURE TRAY) ×3 IMPLANT
SHEATH URETERAL 12FRX28CM (UROLOGICAL SUPPLIES) IMPLANT
SHEATH URETERAL 12FRX35CM (MISCELLANEOUS) IMPLANT
STENT POLARIS 5FRX26 (STENTS) ×2 IMPLANT
SYR CONTROL 10ML LL (SYRINGE) ×3 IMPLANT
TUBE FEEDING 8FR 16IN STR KANG (MISCELLANEOUS) ×3 IMPLANT
TUBING CONNECTING 10 (TUBING) ×2 IMPLANT
TUBING CONNECTING 10' (TUBING) ×1

## 2018-04-22 NOTE — Anesthesia Preprocedure Evaluation (Addendum)
Anesthesia Evaluation  Patient identified by MRN, date of birth, ID band Patient awake    Reviewed: Allergy & Precautions, NPO status , Patient's Chart, lab work & pertinent test results  Airway Mallampati: II  TM Distance: >3 FB Neck ROM: Full    Dental  (+) Teeth Intact, Dental Advisory Given   Pulmonary sleep apnea and Continuous Positive Airway Pressure Ventilation ,    Pulmonary exam normal breath sounds clear to auscultation       Cardiovascular hypertension, Normal cardiovascular exam Rhythm:Regular Rate:Normal     Neuro/Psych negative neurological ROS  negative psych ROS   GI/Hepatic Neg liver ROS, GERD  ,  Endo/Other  diabetes, Type obesity  Renal/GU LEFT URETERAL STONE     Musculoskeletal negative musculoskeletal ROS (+)   Abdominal   Peds  Hematology negative hematology ROS (+)   Anesthesia Other Findings Day of surgery medications reviewed with the patient.  Reproductive/Obstetrics                            Anesthesia Physical Anesthesia Plan  ASA: III  Anesthesia Plan: General   Post-op Pain Management:    Induction: Intravenous  PONV Risk Score and Plan: 3 and Midazolam, Dexamethasone and Ondansetron  Airway Management Planned: LMA  Additional Equipment:   Intra-op Plan:   Post-operative Plan: Extubation in OR  Informed Consent: I have reviewed the patients History and Physical, chart, labs and discussed the procedure including the risks, benefits and alternatives for the proposed anesthesia with the patient or authorized representative who has indicated his/her understanding and acceptance.     Dental advisory given  Plan Discussed with: CRNA  Anesthesia Plan Comments:         Anesthesia Quick Evaluation

## 2018-04-22 NOTE — Brief Op Note (Signed)
04/22/2018  5:14 PM  PATIENT:  Edgar Kidd  48 y.o. male  PRE-OPERATIVE DIAGNOSIS:  LEFT URETERAL STONE  POST-OPERATIVE DIAGNOSIS:  LEFT URETERAL STONE  PROCEDURE:  Procedure(s) with comments: CYSTOSCOPY WITH RETROGRADE PYELOGRAM, URETEROSCOPY AND STENT PLACEMENT (Left) - 75 MINS HOLMIUM LASER APPLICATION (Left)  SURGEON:  Surgeon(s) and Role:    * Sebastian Ache, MD - Primary  PHYSICIAN ASSISTANT:   ASSISTANTS: none   ANESTHESIA:   general  EBL:  0 mL   BLOOD ADMINISTERED:none  DRAINS: none   LOCAL MEDICATIONS USED:  NONE  SPECIMEN:  Source of Specimen:  left ureteral stone fragments  DISPOSITION OF SPECIMEN:  [facility] for compositional analysis  COUNTS:  YES  TOURNIQUET:  * No tourniquets in log *  DICTATION: .Other Dictation: Dictation Number [id number]  PLAN OF CARE: Discharge to home after PACU  PATIENT DISPOSITION:  PACU - hemodynamically stable.   Delay start of Pharmacological VTE agent (>24hrs) due to surgical blood loss or risk of bleeding: yes

## 2018-04-22 NOTE — Anesthesia Procedure Notes (Signed)
Procedure Name: LMA Insertion Date/Time: 04/22/2018 4:27 PM Performed by: Vanessa Everest, CRNA Pre-anesthesia Checklist: Emergency Drugs available, Patient identified, Suction available and Patient being monitored Patient Re-evaluated:Patient Re-evaluated prior to induction Oxygen Delivery Method: Circle system utilized Preoxygenation: Pre-oxygenation with 100% oxygen Induction Type: IV induction Ventilation: Mask ventilation without difficulty LMA: LMA inserted LMA Size: 5.0 Number of attempts: 1 Placement Confirmation: positive ETCO2 and breath sounds checked- equal and bilateral Tube secured with: Tape Dental Injury: Teeth and Oropharynx as per pre-operative assessment

## 2018-04-22 NOTE — Discharge Instructions (Signed)
1 - You may have urinary urgency (bladder spasms) and bloody urine on / off with stent in place. This is normal.  2 - Remove tethered stent on Friday morning by pulling on string, then blue-white plastic tubing, and discarding. Office is open Friday if any problems arise.   3 - Call MD or go to ER for fever >102, severe pain / nausea / vomiting not relieved by medications, or acute change in medical status   General Anesthesia, Adult, Care After This sheet gives you information about how to care for yourself after your procedure. Your health care provider may also give you more specific instructions. If you have problems or questions, contact your health care provider. What can I expect after the procedure? After the procedure, the following side effects are common:  Pain or discomfort at the IV site.  Nausea.  Vomiting.  Sore throat.  Trouble concentrating.  Feeling cold or chills.  Weak or tired.  Sleepiness and fatigue.  Soreness and body aches. These side effects can affect parts of the body that were not involved in surgery. Follow these instructions at home:  For at least 24 hours after the procedure:  Have a responsible adult stay with you. It is important to have someone help care for you until you are awake and alert.  Rest as needed.  Do not: ? Participate in activities in which you could fall or become injured. ? Drive. ? Use heavy machinery. ? Drink alcohol. ? Take sleeping pills or medicines that cause drowsiness. ? Make important decisions or sign legal documents. ? Take care of children on your own. Eating and drinking  Follow any instructions from your health care provider about eating or drinking restrictions.  When you feel hungry, start by eating small amounts of foods that are soft and easy to digest (bland), such as toast. Gradually return to your regular diet.  Drink enough fluid to keep your urine pale yellow.  If you vomit, rehydrate by  drinking water, juice, or clear broth. General instructions  If you have sleep apnea, surgery and certain medicines can increase your risk for breathing problems. Follow instructions from your health care provider about wearing your sleep device: ? Anytime you are sleeping, including during daytime naps. ? While taking prescription pain medicines, sleeping medicines, or medicines that make you drowsy.  Return to your normal activities as told by your health care provider. Ask your health care provider what activities are safe for you.  Take over-the-counter and prescription medicines only as told by your health care provider.  If you smoke, do not smoke without supervision.  Keep all follow-up visits as told by your health care provider. This is important. Contact a health care provider if:  You have nausea or vomiting that does not get better with medicine.  You cannot eat or drink without vomiting.  You have pain that does not get better with medicine.  You are unable to pass urine.  You develop a skin rash.  You have a fever.  You have redness around your IV site that gets worse. Get help right away if:  You have difficulty breathing.  You have chest pain.  You have blood in your urine or stool, or you vomit blood. Summary  After the procedure, it is common to have a sore throat or nausea. It is also common to feel tired.  Have a responsible adult stay with you for the first 24 hours after general anesthesia. It is important  to have someone help care for you until you are awake and alert.  When you feel hungry, start by eating small amounts of foods that are soft and easy to digest (bland), such as toast. Gradually return to your regular diet.  Drink enough fluid to keep your urine pale yellow.  Return to your normal activities as told by your health care provider. Ask your health care provider what activities are safe for you. This information is not intended to  replace advice given to you by your health care provider. Make sure you discuss any questions you have with your health care provider. Document Released: 05/06/2000 Document Revised: 09/13/2016 Document Reviewed: 09/13/2016 Elsevier Interactive Patient Education  2019 [employer].

## 2018-04-22 NOTE — Transfer of Care (Signed)
Immediate Anesthesia Transfer of Care Note  Patient: Edgar Kidd  Procedure(s) Performed: CYSTOSCOPY WITH RETROGRADE PYELOGRAM, URETEROSCOPY AND STENT PLACEMENT (Left ) HOLMIUM LASER APPLICATION (Left )  Patient Location: PACU  Anesthesia Type:General  Level of Consciousness: awake, alert , oriented and patient cooperative  Airway & Oxygen Therapy: Patient Spontanous Breathing and Patient connected to face mask  Post-op Assessment: Report given to RN and Post -op Vital signs reviewed and stable  Post vital signs: Reviewed and stable  Last Vitals:  Vitals Value Taken Time  BP 153/98 04/22/2018  5:24 PM  Temp    Pulse 98 04/22/2018  5:28 PM  Resp 16 04/22/2018  5:28 PM  SpO2 100 % 04/22/2018  5:28 PM  Vitals shown include unvalidated device data.  Last Pain:  Vitals:   04/22/18 1527  TempSrc:   PainSc: 4       Patients Stated Pain Goal: 4 (04/22/18 1527)  Complications: No apparent anesthesia complications

## 2018-04-22 NOTE — H&P (Signed)
Edgar Kidd is an 48 y.o. male.    Chief Complaint: Pre-Op LEFT Ureteroscopic Stone Manipulation  HPI:   1 - Recurrent Urolithiasis - Pre 2020 - URS x 1, MET x several  04/2018 - 76m left mid ureteral stone by ER CT. Stone is solitary, 700HU, at level of iliacs, SSD 24cm. UA without infectious parameters. Cr 1.2.   2 -Enlarging Left Renal Cystic Neoplasm - 5cm LLP cystic mass incidetnal on CT 04/2018. Was 1.5cm 2014. No prior contrast imaging. NO mass effect.   PMH sit for morbid obesity, knee arthroscopy, TNA. No ischemic CV disease / blood thinners. He drives 18 wheeler, mostly day trips.   Today " Edgar Kidd " is seen to proceed with LEFT ureteroscopy. NO interval fevers.     Past Medical History:  Diagnosis Date  . Diabetes mellitus   . History of kidney stones   . Hypertension   . Renal disorder   . Sleep apnea    on CPAP     Past Surgical History:  Procedure Laterality Date  . FINGER SURGERY     right little finger  . KNEE ARTHROSCOPY    . ROTATOR CUFF REPAIR    . TONSILLECTOMY AND ADENOIDECTOMY      No family history on file. Social History:  reports that he has never smoked. He has never used smokeless tobacco. He reports that he does not drink alcohol or use drugs.  Allergies:  Allergies  Allergen Reactions  . Shellfish Allergy Anaphylaxis    No medications prior to admission.    Results for orders placed or performed during the hospital encounter of 04/20/18 (from the past 48 hour(s))  Glucose, capillary     Status: None   Collection Time: 04/20/18  4:32 PM  Result Value Ref Range   Glucose-Capillary 87 70 - 99 mg/dL  Hemoglobin A1c     Status: Abnormal   Collection Time: 04/20/18  4:41 PM  Result Value Ref Range   Hgb A1c MFr Bld 7.3 (H) 4.8 - 5.6 %    Comment: (NOTE) Pre diabetes:          5.7%-6.4% Diabetes:              >6.4% Glycemic control for   <7.0% adults with diabetes    Mean Plasma Glucose 162.81 mg/dL    Comment: Performed at  [facility] [place] [address]  No results found.  Review of Systems  Constitutional: Negative.  Negative for chills and fever.  All other systems reviewed and are negative.   There were no vitals taken for this visit. Physical Exam  HENT:  Head: Normocephalic.  Eyes: Pupils are equal, round, and reactive to light.  Neck: Normal range of motion.  Cardiovascular: Normal rate.  Respiratory: Effort normal.  GI:  Stable large truncal obesity.   Genitourinary:    Genitourinary Comments: Mild left CVAT   Musculoskeletal: Normal range of motion.  Neurological: He is alert.  Skin: Skin is warm.  Psychiatric: He has a normal mood and affect.     Assessment/Plan  Proceed as planned with LEFT ureteroscopic stone manipulation. Risks, benefits, alternatives, expected peri-op course discussed previously and reiterated today.   TAlexis Frock MD 04/22/2018, 8:37 AM

## 2018-04-22 NOTE — Anesthesia Postprocedure Evaluation (Signed)
Anesthesia Post Note  Patient: Edgar Kidd  Procedure(s) Performed: CYSTOSCOPY WITH RETROGRADE PYELOGRAM, URETEROSCOPY AND STENT PLACEMENT (Left ) HOLMIUM LASER APPLICATION (Left )     Patient location during evaluation: PACU Anesthesia Type: General Level of consciousness: awake and alert Pain management: pain level controlled Vital Signs Assessment: post-procedure vital signs reviewed and stable Respiratory status: spontaneous breathing, nonlabored ventilation, respiratory function stable and patient connected to nasal cannula oxygen Cardiovascular status: blood pressure returned to baseline and stable Postop Assessment: no apparent nausea or vomiting Anesthetic complications: no    Last Vitals:  Vitals:   04/22/18 1800 04/22/18 1810  BP:    Pulse: 91 91  Resp: 16 18  Temp:  (!) 36.4 C  SpO2: 98% 92%    Last Pain:  Vitals:   04/22/18 1757  TempSrc:   PainSc: 0-No pain                 Cecile Hearing

## 2018-04-23 NOTE — Op Note (Signed)
NAME: Edgar Kidd, BAYLIS MEDICAL RECORD ZD:[id number] ACCOUNT [id number] DATE OF BIRTH:Dec 01, 1970 FACILITY: [facility] LOCATION: [facility] PH[facility]ICIAN:Brandonn Capelli Berneice Heinrich, MD  OPERATIVE REPORT  DATE OF PROCEDURE:  04/22/2018  PREOPERATIVE DIAGNOSIS:  Left ureteral stone with colic.  PROCEDURE: 1.  Cystoscopy, left retrograde pyelogram dilatation. 2.  Left ureteroscopy with laser lithotripsy. 3.  Insertion of left ureteral stent 5 x 26 Polaris with tether.  ESTIMATED BLOOD LOSS:  Nil.  COMPLICATIONS:  None.  SPECIMENS:  Left ureteral stone fragments for composition analysis.  FINDINGS: 1.  Left distal ureteral stone distal fourth of the ureter. 2.  Complete resolution of all ureteral stone fragments following laser lithotripsy and basket extraction. 3.  Distal placement of left ureteral stent proximal renal pelvis, distal end in urinary bladder.  INDICATIONS:  The patient is a pleasant, but quite comorbid 48 year old man with history of recurrent urolithiasis mostly managed medically.  He was found on workup of colicky left flank pain to have a fairly substantial left ureteral stone of at least 8  mm in the mid to distal left ureter.  Options were discussed for management including medical therapy versus shockwave lithotripsy versus ureteroscopy.  Given his very unfavorable body habitus for shockwave lithotripsy and unfavorable predicted success  with medical therapy, he wished to undergo ureteroscopy in an elective setting.  He presents for this today.  Informed consent was obtained and placed in medical record.  DESCRIPTION OF PROCEDURE:  The patient was identified.  The procedure being left ureteroscopic stone ablation was confirmed.  Procedure timeout was performed.  Intravenous antibiotics administered.  General anesthesia induced.  The patient was placed  into a low lithotomy position, sterile field was created by prepping and draping the base of the penis, perineum and proximal thighs  using iodine.  Cystourethroscopy was performed using a 22-French rigid cystoscope with offset lens.  Inspection of  anterior and posterior urethra was unremarkable.  Inspection of bladder revealed no diverticula, calcifications, papillary lesions.  The left ureteral orifice was cannulated with a 6-French renal catheter and left retrograde pyelogram was obtained.  Left retrograde pyelogram revealed a single left ureter with somewhat bifid left kidney.  There was a filling defect in the distal fourth of the ureter with mild hydronephrosis above this consistent with known stone.  A 0.038 ZIPwire was advanced to  lower pole and set aside as a safety wire.  An 8-French feeding tube was placed in the urinary bladder for pressure release, and semirigid ureteroscopy of the distal left ureter alongside a separate sensor working wire.  As anticipated, there was a left  ureteral stone in the distal fourth of the ureter.  This appeared to be much too large for simple basketing.  As such, holmium laser energy applied 70 setting of 0.2 joules and 20 Hz into fragments of approximately 6 smaller pieces.  These were then  sequentially grasped with the long axis and repositioned into the urinary bladder.  Semi-rigid ureteroscopy of the remainder of the distal 4/5 of the left ureter revealed no additional calcifications.  The ureteroscope was then re-exchanged for the  cystoscope and the stone fragments were irrigated from the bladder and set aside for composition analysis.  Given the size of the stone, it was felt that brief interval stenting with a tethered stent would be warranted.  As such, a new 5 x 26 Polaris  stent was placed remaining safety wire using fluoroscopic guidance.  Good proximal and distal planes were noted.  Tether was left in place and fashioned  to the dorsum of the penis.  The procedure terminated.  The patient tolerated the procedure well and  no immediate perioperative complications.  The patient was  taken to postanesthesia care in stable condition with plan for discharge home.  TN/NUANCE  D:04/22/2018 T:04/23/2018 JOB:[id number]

## 2018-04-24 ENCOUNTER — Encounter (HOSPITAL_COMMUNITY): Payer: Self-pay | Admitting: Urology

## 2018-05-07 ENCOUNTER — Other Ambulatory Visit: Payer: Self-pay | Admitting: Urology

## 2018-05-07 DIAGNOSIS — N281 Cyst of kidney, acquired: Secondary | ICD-10-CM

## 2018-08-05 ENCOUNTER — Ambulatory Visit
Admission: RE | Admit: 2018-08-05 | Discharge: 2018-08-05 | Disposition: A | Discharge status: Home / Self Care | Payer: 59 | Source: Ambulatory Visit | Attending: Urology | Admitting: Urology

## 2018-08-05 ENCOUNTER — Other Ambulatory Visit: Payer: 59

## 2018-08-05 DIAGNOSIS — N281 Cyst of kidney, acquired: Secondary | ICD-10-CM

## 2018-08-05 MED ORDER — GADOBENATE DIMEGLUMINE 529 MG/ML IV SOLN
20.0000 mL | Freq: Once | INTRAVENOUS | Status: AC | PRN
  Administered 2018-08-05: 20 mL via INTRAVENOUS

## 2018-10-29 ENCOUNTER — Other Ambulatory Visit: Payer: Self-pay

## 2018-10-29 ENCOUNTER — Ambulatory Visit (HOSPITAL_COMMUNITY)
Admission: EM | Admit: 2018-10-29 | Discharge: 2018-10-29 | Disposition: A | Discharge status: Home / Self Care | Payer: 59 | Attending: Family Medicine | Admitting: Family Medicine

## 2018-10-29 ENCOUNTER — Encounter (HOSPITAL_COMMUNITY): Payer: Self-pay | Admitting: Family Medicine

## 2018-10-29 ENCOUNTER — Ambulatory Visit (INDEPENDENT_AMBULATORY_CARE_PROVIDER_SITE_OTHER): Payer: 59

## 2018-10-29 DIAGNOSIS — S83412A Sprain of medial collateral ligament of left knee, initial encounter: Secondary | ICD-10-CM

## 2018-10-29 MED ORDER — DICLOFENAC SODIUM 75 MG PO TBEC: 75.0000 mg | DELAYED_RELEASE_TABLET | Freq: Two times a day (BID) | ORAL | 0 refills | Status: DC

## 2018-10-29 NOTE — ED Provider Notes (Signed)
[facility]    CSN: [id number] Arrival date & time: 10/29/18  [id number]      History   Chief Complaint Chief Complaint  Patient presents with  . Knee Injury    Left    HPI Edgar Kidd is a 48 y.o. male.   This is a 48 year old established Edgar Kidd urgent care patient who presents with a left knee injury.  He stepped in a hole and had immediate pain.  Now when he bears weight he has more pain than when he is just resting and it is located on the medial side of his left knee.  He was seen back in January for right knee pain.  No diagnosis was listed.  He says that he is waiting for knee replacement.     Past Medical History:  Diagnosis Date  . Diabetes mellitus   . History of kidney stones   . Hypertension   . Renal disorder   . Sleep apnea    on CPAP     There are no active problems to display for this patient.   Past Surgical History:  Procedure Laterality Date  . CYSTOSCOPY WITH RETROGRADE PYELOGRAM, URETEROSCOPY AND STENT PLACEMENT Left 04/22/2018   Procedure: CYSTOSCOPY WITH RETROGRADE PYELOGRAM, URETEROSCOPY AND STENT PLACEMENT;  Surgeon: Sebastian AcheManny, Theodore, MD;  Location: [facility];  Service: Urology;  Laterality: Left;  75 MINS  . FINGER SURGERY     right little finger  . HOLMIUM LASER APPLICATION Left 04/22/2018   Procedure: HOLMIUM LASER APPLICATION;  Surgeon: Sebastian AcheManny, Theodore, MD;  Location: [facility];  Service: Urology;  Laterality: Left;  . KNEE ARTHROSCOPY    . ROTATOR CUFF REPAIR    . TONSILLECTOMY AND ADENOIDECTOMY         Home Medications    Prior to Admission medications   Medication Sig Start Date End Date Taking? Authorizing Provider  diclofenac (VOLTAREN) 75 MG EC tablet Take 1 tablet (75 mg total) by mouth 2 (two) times daily. 10/29/18   Elvina SidleLauenstein, Lucita Montoya, MD  senna-docusate (SENOKOT-S) 8.6-50 MG tablet Take 1 tablet by mouth 2 (two) times daily. While taking strong pain meds to prevent constipation. 04/22/18   Sebastian AcheManny, Theodore, MD   tamsulosin (FLOMAX) 0.4 MG CAPS capsule Take 1 capsule (0.4 mg total) by mouth daily. 04/13/18   Geoffery Lyonselo, Douglas, MD    Family History History reviewed. No pertinent family history.  Social History Social History   Tobacco Use  . Smoking status: Never Smoker  . Smokeless tobacco: Never Used  Substance Use Topics  . Alcohol use: No  . Drug use: No     Allergies   Shellfish allergy   Review of Systems Review of Systems  Constitutional: Negative.   Musculoskeletal: Positive for gait problem.  All other systems reviewed and are negative.    Physical Exam Triage Vital Signs ED Triage Vitals  Enc Vitals Group     BP      Pulse      Resp      Temp      Temp src      SpO2      Weight      Height      Head Circumference      Peak Flow      Pain Score      Pain Loc      Pain Edu?      Excl. in GC?    No data found.  Updated Vital Signs BP (!) 146/106 (  BP Location: Left Wrist)   Pulse 83   Temp (!) 97.3 F (36.3 C) (Temporal)   Resp 15   SpO2 97%    Physical Exam Vitals signs reviewed.  Constitutional:      General: He is not in acute distress.    Appearance: Normal appearance. He is obese. He is not ill-appearing.  HENT:     Head: Normocephalic.  Eyes:     Conjunctiva/sclera: Conjunctivae normal.  Neck:     Musculoskeletal: Normal range of motion.  Cardiovascular:     Rate and Rhythm: Normal rate.  Pulmonary:     Effort: Pulmonary effort is normal.  Musculoskeletal: Normal range of motion.        General: Tenderness and signs of injury present. No swelling or deformity.     Comments: Patient has tenderness at the medial joint line and the proximal medial tibia on the left side.  There is no effusion.  There is no ecchymosis.  Skin:    General: Skin is warm and dry.  Neurological:     General: No focal deficit present.     Mental Status: He is alert and oriented to person, place, and time.  Psychiatric:        Mood and Affect: Mood normal.         Behavior: Behavior normal.      UC Treatments / Results  Labs (all labs ordered are listed, but only abnormal results are displayed) Labs Reviewed - No data to display  EKG   Radiology Left knee film shows mild spurring and diminished joint space medially Procedures Procedures (including critical care time)  Medications Ordered in UC Medications - No data to display  Initial Impression / Assessment and Plan / UC Course  I have reviewed the triage vital signs and the nursing notes.  Pertinent labs & imaging results that were available during my care of the patient were reviewed by me and considered in my medical decision making (see chart for details).     Final Clinical Impressions(s) / UC Diagnoses   Final diagnoses:  Sprain of medial collateral ligament of left knee, initial encounter     Discharge Instructions     The x-rays do not show any bony damage.  Your injury is a knee sprain and should improve over the next several days.  If you find that the pain is not going away by Monday, please call the orthopedic doctor for an appointment.  Keep up the hard work on losing weight.   Your blood pressure was mildly elevated today.  Please check this weekly, and if it remains over 140/90 (either number) please follow up with your personal care provider.  Limiting salt and reducing your weight will help you control the blood pressure.    ED Prescriptions    Medication Sig Dispense Auth. Provider   diclofenac (VOLTAREN) 75 MG EC tablet Take 1 tablet (75 mg total) by mouth 2 (two) times daily. 14 tablet Robyn Haber, MD     Controlled Substance Prescriptions [place] Controlled Substance Registry consulted? Not Applicable   Robyn Haber, MD 10/29/18 1121

## 2018-10-29 NOTE — Discharge Instructions (Addendum)
The x-rays do not show any bony damage.  Your injury is a knee sprain and should improve over the next several days.  If you find that the pain is not going away by Monday, please call the orthopedic doctor for an appointment.  Keep up the hard work on losing weight.   Your blood pressure was mildly elevated today.  Please check this weekly, and if it remains over 140/90 (either number) please follow up with your personal care provider.  Limiting salt and reducing your weight will help you control the blood pressure.

## 2018-10-29 NOTE — ED Triage Notes (Signed)
Pt states he stepped in a hole yesterday and twisted is left knee.  He is having pain to the lower medial side of the knee.

## 2019-05-05 ENCOUNTER — Other Ambulatory Visit: Payer: Self-pay

## 2019-05-05 ENCOUNTER — Ambulatory Visit (INDEPENDENT_AMBULATORY_CARE_PROVIDER_SITE_OTHER): Payer: 59

## 2019-05-05 ENCOUNTER — Encounter (HOSPITAL_COMMUNITY): Payer: Self-pay | Admitting: Emergency Medicine

## 2019-05-05 ENCOUNTER — Ambulatory Visit (HOSPITAL_COMMUNITY)
Admission: EM | Admit: 2019-05-05 | Discharge: 2019-05-05 | Disposition: A | Discharge status: Home / Self Care | Payer: 59

## 2019-05-05 DIAGNOSIS — M79671 Pain in right foot: Secondary | ICD-10-CM

## 2019-05-05 DIAGNOSIS — M25561 Pain in right knee: Secondary | ICD-10-CM

## 2019-05-05 DIAGNOSIS — W19XXXA Unspecified fall, initial encounter: Secondary | ICD-10-CM | POA: Diagnosis not present

## 2019-05-05 DIAGNOSIS — M25571 Pain in right ankle and joints of right foot: Secondary | ICD-10-CM

## 2019-05-05 NOTE — ED Triage Notes (Signed)
PT fell Sunday morning and injured right foot, right ankle, and right knee. He is able to bear weight and ambulate.

## 2019-05-05 NOTE — Discharge Instructions (Signed)
Use anti-inflammatories for pain/swelling. You may take up to 800 mg Ibuprofen every 8 hours with food. You may supplement Ibuprofen with Tylenol [phone] mg every 8 hours.   Ice and elevate Rest  Follow up if not improving

## 2019-05-05 NOTE — ED Provider Notes (Signed)
[facility]    CSN: [id number] Arrival date & time: 05/05/19  1635      History   Chief Complaint Chief Complaint  Patient presents with  . Foot Pain  . Fall    HPI Edgar Kidd is a 49 y.o. male history of DM type II, hypertension, OSA, presenting today for evaluation of right leg pain after fall.  Patient notes Sunday when he got out of bed he stood up, stretched got dizzy and fell.  He believes he landed awkwardly on his right leg.  He has plans to follow-up with his primary care tomorrow in regards to the dizziness.  This has not been recurrent.  Denies any chest pain or shortness of breath.  Denies headache or vision changes.  Denies history of any heart issues other than hypertension.  He reports a lot of pain in his first and second toes especially with weightbearing, the outer aspect of his right ankle and knee.  He has known arthritis and needs knee replacement, but is working on weight loss in order to have this done.  Has been ambulating with a limp.  HPI  Past Medical History:  Diagnosis Date  . Diabetes mellitus   . History of kidney stones   . Hypertension   . Renal disorder   . Sleep apnea    on CPAP     There are no problems to display for this patient.   Past Surgical History:  Procedure Laterality Date  . CYSTOSCOPY WITH RETROGRADE PYELOGRAM, URETEROSCOPY AND STENT PLACEMENT Left 04/22/2018   Procedure: CYSTOSCOPY WITH RETROGRADE PYELOGRAM, URETEROSCOPY AND STENT PLACEMENT;  Surgeon: Alexis Frock, MD;  Location: [facility];  Service: Urology;  Laterality: Left;  75 MINS  . FINGER SURGERY     right little finger  . HOLMIUM LASER APPLICATION Left 5/40/0867   Procedure: HOLMIUM LASER APPLICATION;  Surgeon: Alexis Frock, MD;  Location: [facility];  Service: Urology;  Laterality: Left;  . KNEE ARTHROSCOPY    . ROTATOR CUFF REPAIR    . TONSILLECTOMY AND ADENOIDECTOMY         Home Medications    Prior to Admission medications   Medication  Sig Start Date End Date Taking? Authorizing Provider  amLODipine (NORVASC) 10 MG tablet Take by mouth daily. DOSE UNKNOWN   Yes [provider]  indapamide (LOZOL) 1.25 MG tablet Take 1.25 mg by mouth daily.   Yes [provider]  UNKNOWN TO PATIENT HTN med   Yes [provider]  diclofenac (VOLTAREN) 75 MG EC tablet Take 1 tablet (75 mg total) by mouth 2 (two) times daily. 10/29/18   Robyn Haber, MD  senna-docusate (SENOKOT-S) 8.6-50 MG tablet Take 1 tablet by mouth 2 (two) times daily. While taking strong pain meds to prevent constipation. 04/22/18   Alexis Frock, MD  tamsulosin (FLOMAX) 0.4 MG CAPS capsule Take 1 capsule (0.4 mg total) by mouth daily. 04/13/18   Veryl Speak, MD    Family History No family history on file.  Social History Social History   Tobacco Use  . Smoking status: Never Smoker  . Smokeless tobacco: Never Used  Substance Use Topics  . Alcohol use: No  . Drug use: No     Allergies   Shellfish allergy   Review of Systems Review of Systems  Constitutional: Negative for fatigue and fever.  Eyes: Negative for redness, itching and visual disturbance.  Respiratory: Negative for shortness of breath.   Cardiovascular: Negative for chest pain and  leg swelling.  Gastrointestinal: Negative for nausea and vomiting.  Musculoskeletal: Positive for arthralgias, gait problem, joint swelling and myalgias.  Skin: Negative for color change, rash and wound.  Neurological: Negative for dizziness, syncope, weakness, light-headedness and headaches.     Physical Exam Triage Vital Signs ED Triage Vitals  Enc Vitals Group     BP 05/05/19 1708 (!) 139/100     Pulse Rate 05/05/19 1706 (!) 105     Resp 05/05/19 1706 16     Temp 05/05/19 1706 98.4 F (36.9 C)     Temp Source 05/05/19 1706 Oral     SpO2 05/05/19 1706 96 %     Weight --      Height --      Head Circumference --      Peak Flow --      Pain Score 05/05/19 1703 7     Pain  Loc --      Pain Edu? --      Excl. in GC? --    No data found.  Updated Vital Signs BP (!) 139/100   Pulse (!) 105   Temp 98.4 F (36.9 C) (Oral)   Resp 16   SpO2 96%   Visual Acuity Right Eye Distance:   Left Eye Distance:   Bilateral Distance:    Right Eye Near:   Left Eye Near:    Bilateral Near:     Physical Exam Vitals and nursing note reviewed.  Constitutional:      Appearance: He is well-developed.     Comments: No acute distress  HENT:     Head: Normocephalic and atraumatic.     Nose: Nose normal.  Eyes:     Conjunctiva/sclera: Conjunctivae normal.  Cardiovascular:     Rate and Rhythm: Normal rate.  Pulmonary:     Effort: Pulmonary effort is normal. No respiratory distress.  Abdominal:     General: There is no distension.  Musculoskeletal:        General: Normal range of motion.     Cervical back: Neck supple.     Comments: Right knee: Mild swelling, no discoloration, nontender to palpation over patella, infrapatellar area or medial joint line, tenderness along lateral joint line, nontender and popliteal area, nontender extending into calf, range of motion grossly intact, full extension  Right ankle: No obvious swelling discoloration or deformity, tenderness to palpation along lateral malleolus, nontender anteriorly or to medial malleolus  Right foot: No obvious swelling or discoloration, tender to palpation to distal first and second meta tarsals extending into first and second toes, nontender throughout third through fifth metatarsals Dorsalis pedis 2+   Skin:    General: Skin is warm and dry.  Neurological:     Mental Status: He is alert and oriented to person, place, and time.      UC Treatments / Results  Labs (all labs ordered are listed, but only abnormal results are displayed) Labs Reviewed - No data to display  EKG   Radiology DG Ankle Complete Right  Result Date: 05/05/2019 CLINICAL DATA:  Fall 4 days ago with pain and swelling  and lateral joint line. Known arthritis in knee. Pain in right foot, ankle, and knee. EXAM: RIGHT ANKLE - COMPLETE 3+ VIEW COMPARISON:  None. FINDINGS: Small ill-defined ossific density adjacent to the anterior dorsal talus. No other findings suspicious for fracture. The alignment and ankle mortise are preserved. Plantar calcaneal spur and Achilles tendon enthesophyte. No large joint effusion. There is generalized soft tissue  edema. IMPRESSION: Small ill-defined ossific density adjacent to the anterior dorsal talus, likely represents avulsion injury, of unknown acuity. This may be acute or chronic. Generalized soft tissue edema. Electronically Signed   By: Narda Rutherford M.D.   On: 05/05/2019 17:38   DG Knee Complete 4 Views Right  Result Date: 05/05/2019 CLINICAL DATA:  Fall 4 days ago with pain and swelling and lateral joint line. Known arthritis in knee. Pain in right foot, ankle, and knee. EXAM: RIGHT KNEE - COMPLETE 4+ VIEW COMPARISON:  Right knee MRI 06/08/2010 FINDINGS: No acute fracture or dislocation. Moderate tricompartmental peripheral spurring. Medial tibiofemoral joint space narrowing. No significant knee joint effusion. Mild enthesopathy of the anterior patella. No significant joint effusion. Mild generalized soft tissue edema. IMPRESSION: Moderate tricompartmental osteoarthritis without acute osseous abnormality. No fracture or dislocation of the right knee. Electronically Signed   By: Narda Rutherford M.D.   On: 05/05/2019 17:39   DG Foot Complete Right  Result Date: 05/05/2019 CLINICAL DATA:  Fall 4 days ago with pain and swelling and lateral joint line. Known arthritis in knee. Pain in right foot, ankle, and knee. EXAM: RIGHT FOOT COMPLETE - 3+ VIEW COMPARISON:  None. FINDINGS: Ill-defined osseous density adjacent to the anterior aspect of the dorsal talus, of unknown acuity. No other evidence of fracture. Foot joint spaces and alignment are maintained. Generalized soft tissue edema.  Plantar calcaneal spur and Achilles tendon enthesophyte. IMPRESSION: Ill-defined small ossific density adjacent to the anterior aspect of the dorsal talus, of unknown acuity, suspicious for avulsion injury. This may be acute or chronic. Electronically Signed   By: Narda Rutherford M.D.   On: 05/05/2019 17:36    Procedures Procedures (including critical care time)  Medications Ordered in UC Medications - No data to display  Initial Impression / Assessment and Plan / UC Course  I have reviewed the triage vital signs and the nursing notes.  Pertinent labs & imaging results that were available during my care of the patient were reviewed by me and considered in my medical decision making (see chart for details).     X-rays negative for fracture, foot with possible avulsion above talus, appears chronic, minimal tenderness over this area.  Do not suspect this to be acute.  Most likely sprains of the ankle and foot.  Treating conservatively with Ace wraps, anti-inflammatories ice and rest.  Continue to follow-up with primary care tomorrow as planned for further evaluation of dizziness.  Has not had repeat symptoms and is currently asymptomatic.  Stable for discharge.  Discussed strict return precautions. Patient verbalized understanding and is agreeable with plan.  Final Clinical Impressions(s) / UC Diagnoses   Final diagnoses:  Acute pain of right knee  Acute right ankle pain  Right foot pain  Fall, initial encounter     Discharge Instructions     Use anti-inflammatories for pain/swelling. You may take up to 800 mg Ibuprofen every 8 hours with food. You may supplement Ibuprofen with Tylenol [id number] mg every 8 hours.   Ice and elevate Rest  Follow up if not improving    ED Prescriptions    None     PDMP not reviewed this encounter.   Lew Dawes, [place] 05/05/19 1847

## 2020-11-12 IMAGING — DX DG ANKLE COMPLETE 3+V*R*
3 series · 3 of 3 positions shown · non-contrast
Comparison: None.

CLINICAL DATA: Fall 4 days ago with pain and swelling and lateral
joint line. Known arthritis in knee. Pain in right foot, ankle, and
knee.

EXAM:
RIGHT ANKLE - COMPLETE 3+ VIEW

[ankle ap]
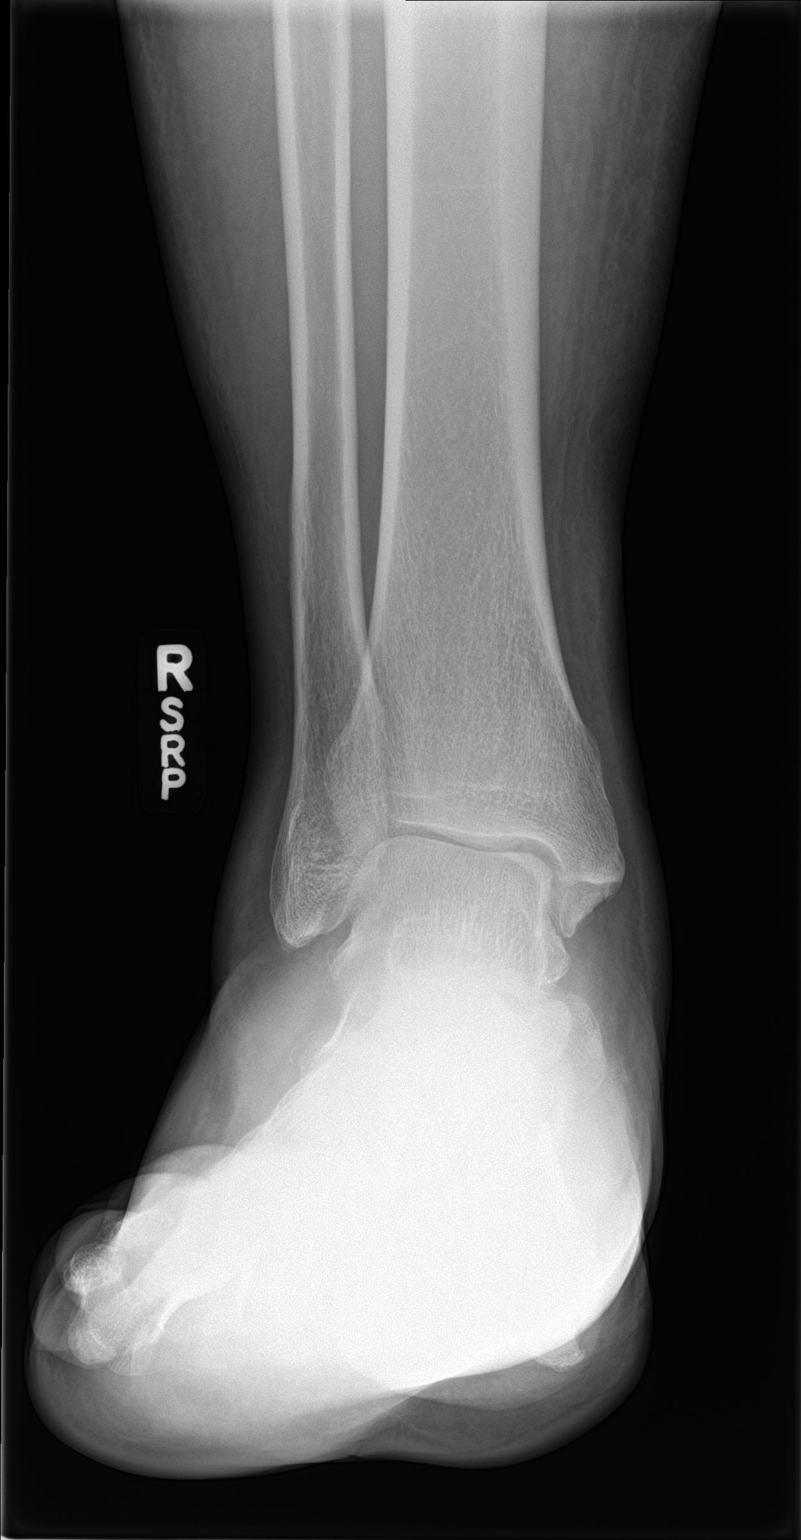

[ankle obl]
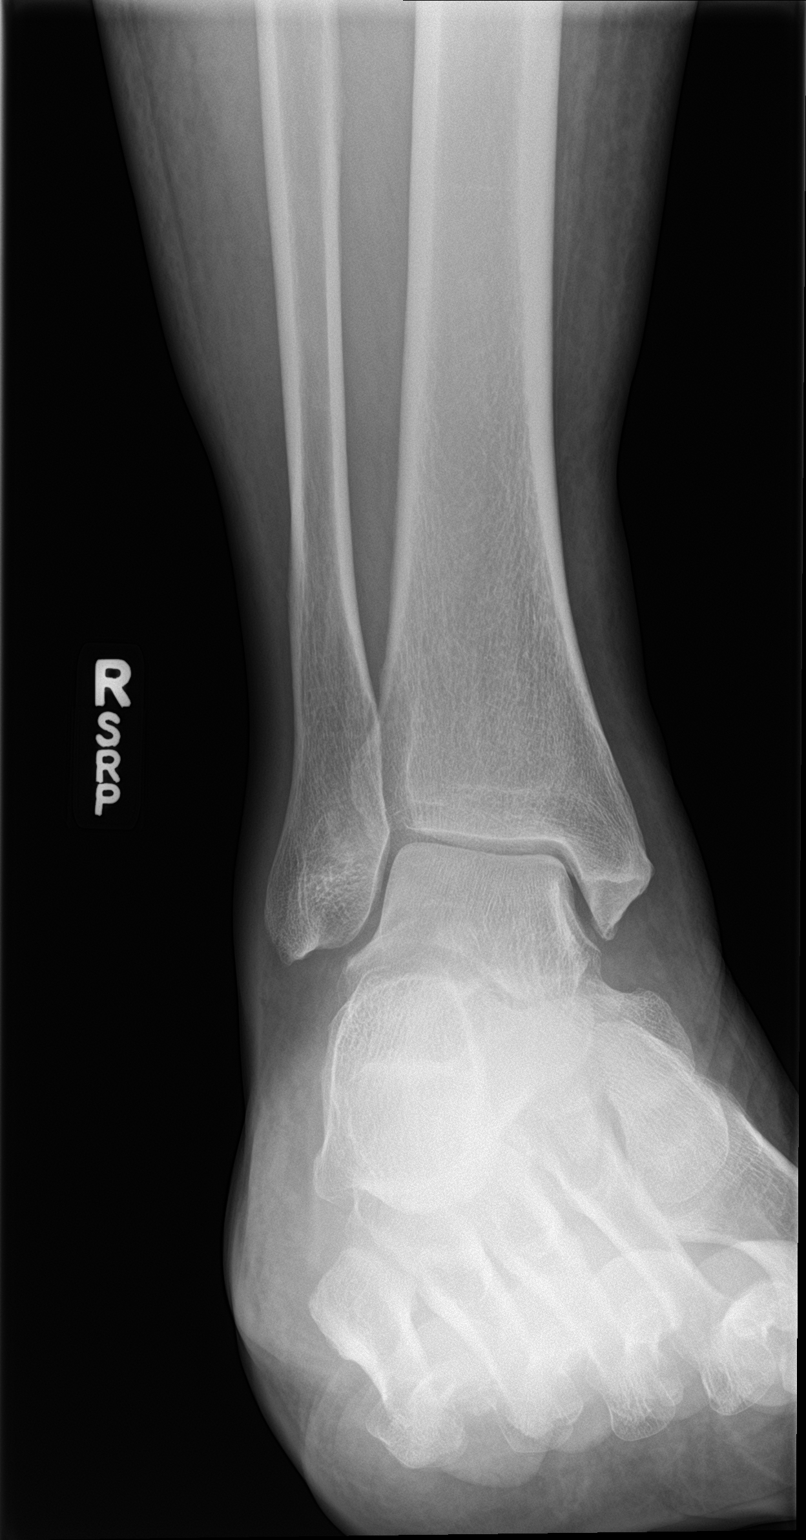

[ankle lat]
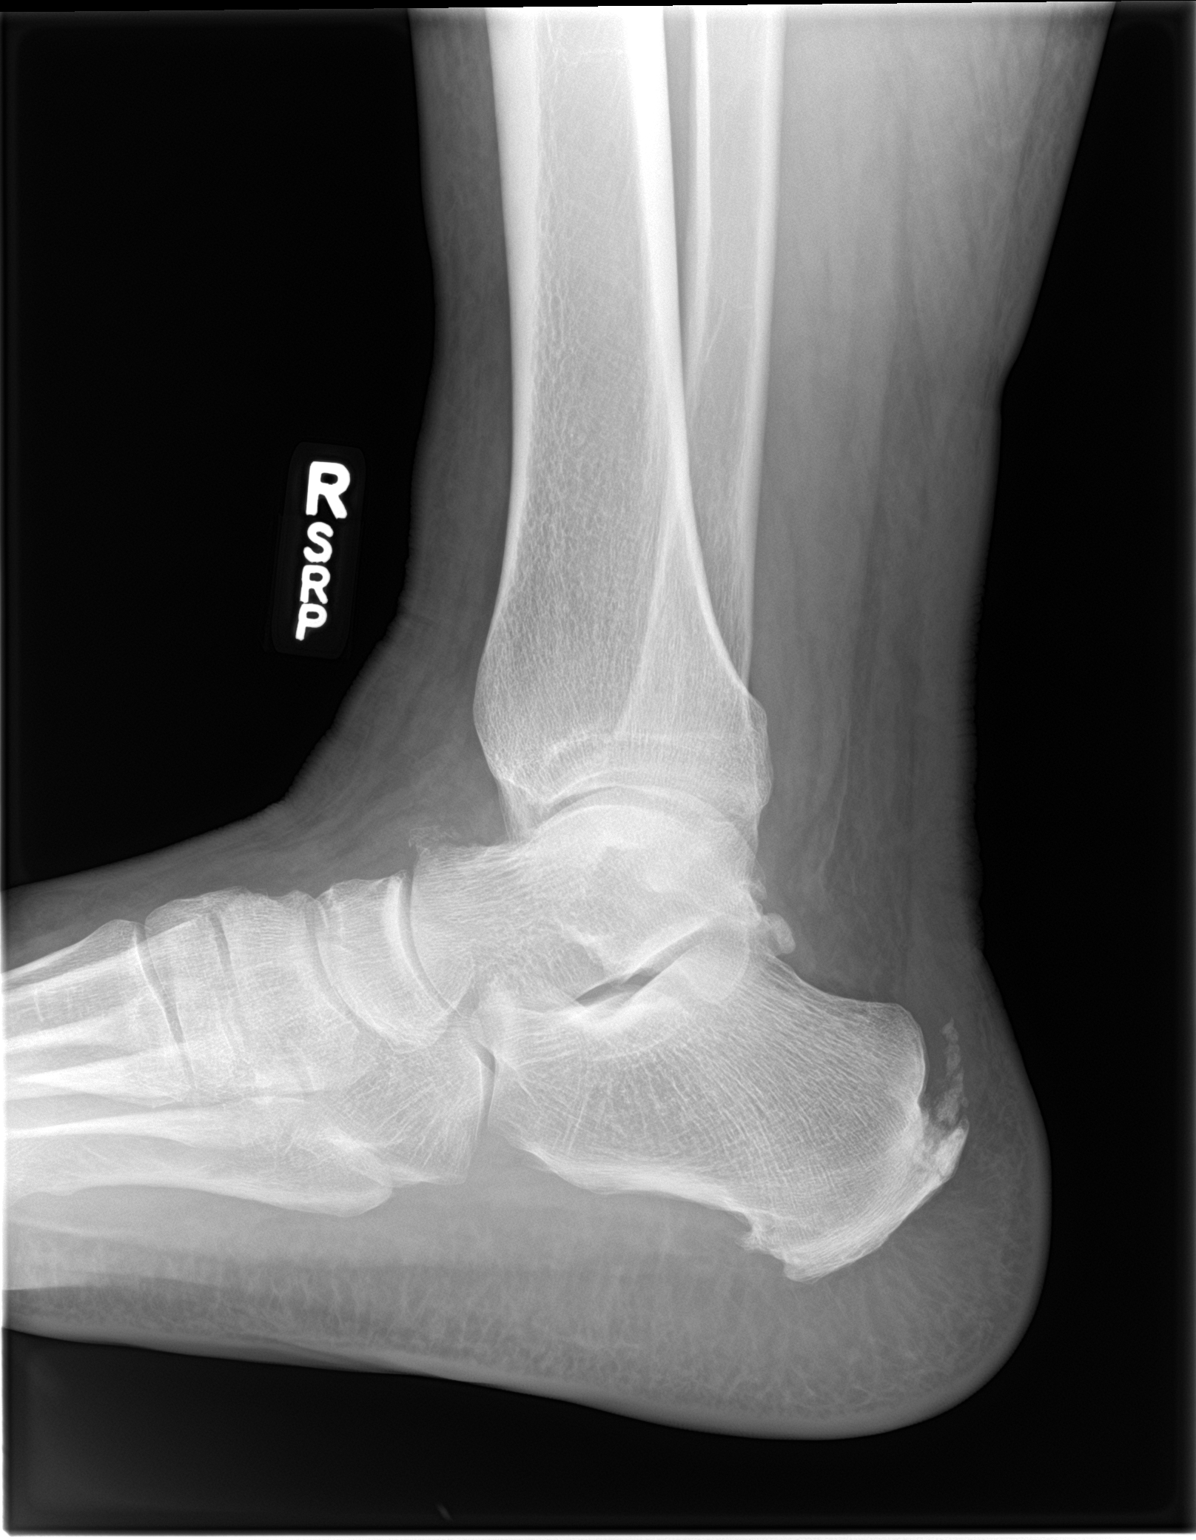

[3 of 3 positions shown; findings below may reference images not displayed]

FINDINGS: Small ill-defined ossific density adjacent to the anterior dorsal
talus. No other findings suspicious for fracture. The alignment and
ankle mortise are preserved. Plantar calcaneal spur and Achilles
tendon enthesophyte. No large joint effusion. There is generalized
soft tissue edema.
IMPRESSION: Small ill-defined ossific density adjacent to the anterior dorsal
talus, likely represents avulsion injury, of unknown acuity. This
may be acute or chronic.

Generalized soft tissue edema.

## 2021-02-25 ENCOUNTER — Encounter (HOSPITAL_COMMUNITY): Payer: Self-pay | Admitting: Emergency Medicine

## 2021-02-25 ENCOUNTER — Other Ambulatory Visit: Payer: Self-pay

## 2021-02-25 ENCOUNTER — Ambulatory Visit (HOSPITAL_COMMUNITY)
Admission: EM | Admit: 2021-02-25 | Discharge: 2021-02-25 | Disposition: A | Discharge status: Home / Self Care | Payer: 59 | Attending: Student | Admitting: Student

## 2021-02-25 DIAGNOSIS — E1169 Type 2 diabetes mellitus with other specified complication: Secondary | ICD-10-CM | POA: Diagnosis not present

## 2021-02-25 DIAGNOSIS — Z6841 Body Mass Index (BMI) 40.0 and over, adult: Secondary | ICD-10-CM

## 2021-02-25 DIAGNOSIS — M5440 Lumbago with sciatica, unspecified side: Secondary | ICD-10-CM

## 2021-02-25 MED ORDER — IBUPROFEN 800 MG PO TABS: 800.0000 mg | ORAL_TABLET | Freq: Three times a day (TID) | ORAL | 0 refills | Status: DC

## 2021-02-25 MED ORDER — METHYLPREDNISOLONE SODIUM SUCC 125 MG IJ SOLR
60.0000 mg | Freq: Once | INTRAMUSCULAR | Status: AC
  Administered 2021-02-25: 60 mg via INTRAMUSCULAR

## 2021-02-25 MED ORDER — METHYLPREDNISOLONE SODIUM SUCC 125 MG IJ SOLR
INTRAMUSCULAR | Status: AC
  Filled 2021-02-25: qty 2

## 2021-02-25 MED ORDER — TIZANIDINE HCL 2 MG PO TABS: 2.0000 mg | ORAL_TABLET | Freq: Three times a day (TID) | ORAL | 0 refills | Status: DC | PRN

## 2021-02-25 NOTE — ED Triage Notes (Signed)
PT reports lower back pain and pain that radiates down left leg for over 1 week.

## 2021-02-25 NOTE — ED Provider Notes (Signed)
[facility]    CSN: [id number] Arrival date & time: 02/25/21  1132      History   Chief Complaint Chief Complaint  Patient presents with   Back Pain   Leg Pain    HPI Edgar Kidd is a 51 y.o. male presenting with lower back pain and pain radiating down the back of the L leg x1 week. Medical history hypertension, nephrolithiasis, diabetes, obesity, sleep apnea.  States he has had back issues following a fall from a tractor resulting in a vertebral fracture 2015, has followed with Ortho for this in the past.  Describes left-sided lower back pain and pain radiating down the backside of his left leg. The sciatica is new.  Denies known triggers, though he does have an active job.  Has attempted Tylenol, heat, rest without improvement.  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 Denies numbness in arms/legs, denies weakness in arms/legs, denies saddle anesthesia, denies bowel/bladder incontinence, denies urinary retention, denies constipation.  For diabetes, sugars running 100-110 fasting at home.   HPI  Past Medical History:  Diagnosis Date   Diabetes mellitus    History of kidney stones    Hypertension    Renal disorder    Sleep apnea    on CPAP     There are no problems to display for this patient.   Past Surgical History:  Procedure Laterality Date   CYSTOSCOPY WITH RETROGRADE PYELOGRAM, URETEROSCOPY AND STENT PLACEMENT Left 04/22/2018   Procedure: CYSTOSCOPY WITH RETROGRADE PYELOGRAM, URETEROSCOPY AND STENT PLACEMENT;  Surgeon: Sebastian Ache, MD;  Location: [facility];  Service: Urology;  Laterality: Left;  75 MINS   FINGER SURGERY     right little finger   HOLMIUM LASER APPLICATION Left 04/22/2018   Procedure: HOLMIUM LASER APPLICATION;  Surgeon: Sebastian Ache, MD;  Location: [facility];  Service: Urology;  Laterality: Left;   KNEE ARTHROSCOPY     ROTATOR CUFF REPAIR     TONSILLECTOMY AND ADENOIDECTOMY          Home Medications    Prior to Admission medications   Medication Sig Start Date End Date Taking? Authorizing Provider  ibuprofen (ADVIL) 800 MG tablet Take 1 tablet (800 mg total) by mouth 3 (three) times daily. 02/25/21  Yes Rhys Martini, PA-C  tiZANidine (ZANAFLEX) 2 MG tablet Take 1 tablet (2 mg total) by mouth every 8 (eight) hours as needed for muscle spasms. 02/25/21  Yes Rhys Martini, PA-C  amLODipine (NORVASC) 10 MG tablet Take by mouth daily. DOSE UNKNOWN    [provider]  indapamide (LOZOL) 1.25 MG tablet Take 1.25 mg by mouth daily.    [provider]  senna-docusate (SENOKOT-S) 8.6-50 MG tablet Take 1 tablet by mouth 2 (two) times daily. While taking strong pain meds to prevent constipation. 04/22/18   Sebastian Ache, MD  tamsulosin (FLOMAX) 0.4 MG CAPS capsule Take 1 capsule (0.4 mg total) by mouth daily. 04/13/18   Geoffery Lyons, MD  UNKNOWN TO PATIENT HTN med    [provider]    Family History No family history on file.  Social History Social History   Tobacco Use   Smoking status: Never   Smokeless tobacco: Never  Vaping Use   Vaping Use: Never used  Substance Use Topics   Alcohol use: No   Drug use: No     Allergies   Shellfish allergy   Review of Systems Review  of Systems  Musculoskeletal:  Positive for back pain.  All other systems reviewed and are negative.   Physical Exam Triage Vital Signs ED Triage Vitals  Enc Vitals Group     BP 02/25/21 1204 (!) 142/95     Pulse Rate 02/25/21 1204 89     Resp 02/25/21 1204 16     Temp 02/25/21 1204 98.2 F (36.8 C)     Temp Source 02/25/21 1204 Oral     SpO2 02/25/21 1204 96 %     Weight --      Height --      Head Circumference --      Peak Flow --      Pain Score 02/25/21 1203 7     Pain Loc --      Pain Edu? --      Excl. in GC? --    No data found.  Updated Vital Signs BP (!) 142/95    Pulse 89    Temp 98.2 F (36.8 C) (Oral)    Resp 16    SpO2 96%    Visual Acuity Right Eye Distance:   Left Eye Distance:   Bilateral Distance:    Right Eye Near:   Left Eye Near:    Bilateral Near:     Physical Exam Vitals reviewed.  Constitutional:      General: He is not in acute distress.    Appearance: Normal appearance. He is obese. He is not ill-appearing.  HENT:     Head: Normocephalic and atraumatic.  Cardiovascular:     Rate and Rhythm: Normal rate and regular rhythm.     Heart sounds: Normal heart sounds.  Pulmonary:     Effort: Pulmonary effort is normal.     Breath sounds: Normal breath sounds and air entry.  Abdominal:     Tenderness: There is no abdominal tenderness. There is no right CVA tenderness, left CVA tenderness, guarding or rebound.  Musculoskeletal:     Cervical back: Normal range of motion. No swelling, deformity, signs of trauma, rigidity, spasms, tenderness, bony tenderness or crepitus. No pain with movement.     Thoracic back: No swelling, deformity, signs of trauma, spasms, tenderness or bony tenderness. Normal range of motion. No scoliosis.     Lumbar back: Spasms and tenderness present. No swelling, deformity, signs of trauma or bony tenderness. Normal range of motion. Positive left straight leg raise test. Negative right straight leg raise test. No scoliosis.     Comments: Midline spinous tenderness in lumbar and cervical regions. No midline spinous deformity, stepoff.  Also with left-sided lumbar paraspinous muscle tenderness.  Positive left straight leg raise.  Strength and sensation grossly intact upper and lower extremities, no saddle anesthesia.  Gait intact.  Exam somewhat limited by patient body habitus.  Absolutely no other injury, deformity, tenderness, ecchymosis, abrasion.  Neurological:     General: No focal deficit present.     Mental Status: He is alert.     Cranial Nerves: No cranial nerve deficit.  Psychiatric:        Mood and Affect: Mood normal.        Behavior: Behavior normal.         Thought Content: Thought content normal.        Judgment: Judgment normal.     UC Treatments / Results  Labs (all labs ordered are listed, but only abnormal results are displayed) Labs Reviewed - No data to display  EKG   Radiology No results  found.  Procedures Procedures (including critical care time)  Medications Ordered in UC Medications  methylPREDNISolone sodium succinate (SOLU-MEDROL) 125 mg/2 mL injection 60 mg (has no administration in time range)    Initial Impression / Assessment and Plan / UC Course  I have reviewed the triage vital signs and the nursing notes.  Pertinent labs & imaging results that were available during my care of the patient were reviewed by me and considered in my medical decision making (see chart for details).     This patient is a very pleasant 51 y.o. year old male presenting with lumbar strain and L sciatica. Has been followed by ortho for this issue in the past - [facility]. He is a diabetic- sugars running 100s fasting at home. New onset of L sciatica, but no new urinary symptoms, weakness or sensation changes.  IM solumedrol administered today, continue to monitor sugars.   Zanaflex and ibuprofen sent. ROM exercises.   Follow-up with [facility] for additional management.  I do think that imaging is indicated.  We are unable to perform imaging at this urgent care given patient weight/ limitations of xray machine.  Red flag symptoms, and ED return precautions discussed. Patient and daughter verbalizes understanding and agreement.     Final Clinical Impressions(s) / UC Diagnoses   Final diagnoses:  Back pain of lumbar region with sciatica  Class 3 severe obesity due to excess calories with body mass index (BMI) of 50.0 to 59.9 in adult, unspecified whether serious comorbidity present (HCC)  Type 2 diabetes mellitus with other specified complication, without long-term current use of insulin (HCC)     Discharge Instructions       -Start the muscle relaxer-Zanaflex (tizanidine), up to 3 times daily for muscle spasms and pain.  This can make you drowsy, so take at bedtime or when you do not need to drive or operate machinery. -You can take Tylenol up to 1000 mg 3 times daily, and ibuprofen up to 800 mg 3 times daily with food.  You can take these together, or alternate every 3-4 hours. -Rest, ice/heat, gentle stretchign  -Follow-up with an orthopedist. I recommend [facility] at [address] [place], [place] [address]. You can schedule an appointment by calling [phone]) or online ([email or web address]), but they also have a walk-in clinic M-F 8a-8p and Sat 10a-3p. -Head to the ED if severe unrelenting back pain; new weakness in legs; new urinary symptoms or constipation.     ED Prescriptions     Medication Sig Dispense Auth. Provider   tiZANidine (ZANAFLEX) 2 MG tablet Take 1 tablet (2 mg total) by mouth every 8 (eight) hours as needed for muscle spasms. 21 tablet Rhys Martini, PA-C   ibuprofen (ADVIL) 800 MG tablet Take 1 tablet (800 mg total) by mouth 3 (three) times daily. 21 tablet Rhys Martini, PA-C      PDMP not reviewed this encounter.   Rhys Martini, PA-C 02/25/21 1231

## 2021-02-25 NOTE — Discharge Instructions (Addendum)
-  Start the muscle relaxer-Zanaflex (tizanidine), up to 3 times daily for muscle spasms and pain.  This can make you drowsy, so take at bedtime or when you do not need to drive or operate machinery. -You can take Tylenol up to 1000 mg 3 times daily, and ibuprofen up to 800 mg 3 times daily with food.  You can take these together, or alternate every 3-4 hours. -Rest, ice/heat, gentle stretchign  -Follow-up with an orthopedist. I recommend [facility] at [address] [place], [place] [address]. You can schedule an appointment by calling [phone]) or online ([email or web address]), but they also have a walk-in clinic M-F 8a-8p and Sat 10a-3p. -Head to the ED if severe unrelenting back pain; new weakness in legs; new urinary symptoms or constipation.

## 2021-06-01 ENCOUNTER — Encounter (HOSPITAL_COMMUNITY): Payer: Self-pay | Admitting: Emergency Medicine

## 2021-06-01 ENCOUNTER — Ambulatory Visit (HOSPITAL_COMMUNITY)
Admission: EM | Admit: 2021-06-01 | Discharge: 2021-06-01 | Disposition: A | Discharge status: Home / Self Care | Payer: 59 | Attending: Family Medicine | Admitting: Family Medicine

## 2021-06-01 DIAGNOSIS — M898X1 Other specified disorders of bone, shoulder: Secondary | ICD-10-CM | POA: Diagnosis not present

## 2021-06-01 DIAGNOSIS — M5412 Radiculopathy, cervical region: Secondary | ICD-10-CM

## 2021-06-01 MED ORDER — TIZANIDINE HCL 4 MG PO TABS: 4.0000 mg | ORAL_TABLET | Freq: Three times a day (TID) | ORAL | 0 refills | Status: DC | PRN

## 2021-06-01 MED ORDER — KETOROLAC TROMETHAMINE 10 MG PO TABS: 10.0000 mg | ORAL_TABLET | Freq: Two times a day (BID) | ORAL | 0 refills | Status: DC | PRN

## 2021-06-01 MED ORDER — DEXAMETHASONE SODIUM PHOSPHATE 10 MG/ML IJ SOLN
INTRAMUSCULAR | Status: AC
  Filled 2021-06-01: qty 1

## 2021-06-01 MED ORDER — DEXAMETHASONE SODIUM PHOSPHATE 10 MG/ML IJ SOLN
10.0000 mg | Freq: Once | INTRAMUSCULAR | Status: AC
  Administered 2021-06-01: 10 mg via INTRAMUSCULAR

## 2021-06-01 NOTE — ED Triage Notes (Signed)
Pt reports upper back pain under left shoulder blade 1.5 weeks and pain at the base of the neck x 2 weeks. Denies any obvious injuries.  ?

## 2021-06-01 NOTE — ED Provider Notes (Signed)
?[facility] ? ? ? ?CSN: [id number] ?Arrival date & time: 06/01/21  1622 ? ? ?  ? ?History   ?Chief Complaint ?No chief complaint on file. ? ? ?HPI ?Edgar Kidd is a 51 y.o. male.  ? ?HPI ?Patient with a history of type 2 diabetes and chronic degenerative spinous disease presents today with left shoulder pain, neck pain radiating into the right shoulder.  Patient denies any known injury.  He is a [employer].  Reports symptoms have been present for approximately 1-1/2 weeks.  He has been seen previously by orthopedics who prescribed anti-inflammatories along with muscle relaxers.  He reports he is out of muscle relaxers.  He reports that he recently had his A1c checked and it is greater than 8.  He was seen at [facility] a month and a half ago for similar problem.  He reports [facility] to follow back up if his symptoms worsen or do not improve. ?Past Medical History:  ?Diagnosis Date  ? Diabetes mellitus   ? History of kidney stones   ? Hypertension   ? Renal disorder   ? Sleep apnea   ? on CPAP   ? ? ?There are no problems to display for this patient. ? ? ?Past Surgical History:  ?Procedure Laterality Date  ? CYSTOSCOPY WITH RETROGRADE PYELOGRAM, URETEROSCOPY AND STENT PLACEMENT Left 04/22/2018  ? Procedure: CYSTOSCOPY WITH RETROGRADE PYELOGRAM, URETEROSCOPY AND STENT PLACEMENT;  Surgeon: Sebastian Ache, MD;  Location: [facility];  Service: Urology;  Laterality: Left;  75 MINS  ? FINGER SURGERY    ? right little finger  ? HOLMIUM LASER APPLICATION Left 04/22/2018  ? Procedure: HOLMIUM LASER APPLICATION;  Surgeon: Sebastian Ache, MD;  Location: [facility];  Service: Urology;  Laterality: Left;  ? KNEE ARTHROSCOPY    ? ROTATOR CUFF REPAIR    ? TONSILLECTOMY AND ADENOIDECTOMY    ? ? ? ? ? ?Home Medications   ? ?Prior to Admission medications   ?Medication Sig Start Date End Date Taking? Authorizing Provider  ?amLODipine (NORVASC) 10 MG tablet Take by mouth daily. DOSE UNKNOWN    [provider]   ?ibuprofen (ADVIL) 800 MG tablet Take 1 tablet (800 mg total) by mouth 3 (three) times daily. 02/25/21   Rhys Martini, PA-C  ?indapamide (LOZOL) 1.25 MG tablet Take 1.25 mg by mouth daily.    [provider]  ?senna-docusate (SENOKOT-S) 8.6-50 MG tablet Take 1 tablet by mouth 2 (two) times daily. While taking strong pain meds to prevent constipation. 04/22/18   Sebastian Ache, MD  ?tamsulosin (FLOMAX) 0.4 MG CAPS capsule Take 1 capsule (0.4 mg total) by mouth daily. 04/13/18   Geoffery Lyons, MD  ?tiZANidine (ZANAFLEX) 2 MG tablet Take 1 tablet (2 mg total) by mouth every 8 (eight) hours as needed for muscle spasms. 02/25/21   Rhys Martini, PA-C  ?UNKNOWN TO PATIENT HTN med    [provider]  ? ? ?Family History ?No family history on file. ? ?Social History ?Social History  ? ?Tobacco Use  ? Smoking status: Never  ? Smokeless tobacco: Never  ?Vaping Use  ? Vaping Use: Never used  ?Substance Use Topics  ? Alcohol use: No  ? Drug use: No  ? ? ? ?Allergies   ?Shellfish allergy ? ?Review of Systems ?Review of Systems ?Pertinent negatives listed in HPI  ? ?Physical Exam ?Triage Vital Signs ?ED Triage Vitals  ?Enc Vitals Group  ?   BP   ?  Pulse   ?   Resp   ?   Temp   ?   Temp src   ?   SpO2   ?   Weight   ?   Height   ?   Head Circumference   ?   Peak Flow   ?   Pain Score   ?   Pain Loc   ?   Pain Edu?   ?   Excl. in GC?   ? ?No data found. ? ?Updated Vital Signs ?Pulse 87   Temp 98.1 ?F (36.7 ?C) (Oral)   Resp 17   Ht 6' (1.829 m)   SpO2 96%   BMI 52.47 kg/m?  ? ?Visual Acuity ?Right Eye Distance:   ?Left Eye Distance:   ?Bilateral Distance:   ? ?Right Eye Near:   ?Left Eye Near:    ?Bilateral Near:    ? ?Physical Exam ?Constitutional:   ?   Appearance: He is obese.  ?HENT:  ?   Head: Normocephalic.  ?Eyes:  ?   Extraocular Movements: Extraocular movements intact.  ?   Pupils: Pupils are equal, round, and reactive to light.  ?Cardiovascular:  ?   Rate and Rhythm: Normal rate and regular  rhythm.  ?Pulmonary:  ?   Effort: Pulmonary effort is normal.  ?   Breath sounds: Normal breath sounds.  ?Musculoskeletal:  ?   Cervical back: Tenderness present. Pain with movement and spinous process tenderness present.  ?Skin: ?   General: Skin is warm and dry.  ?   Capillary Refill: Capillary refill takes less than 2 seconds.  ?Neurological:  ?   General: No focal deficit present.  ?   Mental Status: He is alert.  ?   Gait: Gait normal.  ?Psychiatric:     ?   Mood and Affect: Mood normal.  ? ? ? ?UC Treatments / Results  ?Labs ?(all labs ordered are listed, but only abnormal results are displayed) ?Labs Reviewed - No data to display ? ?EKG ? ? ?Radiology ?No results found. ? ?Procedures ?Procedures (including critical care time) ? ?Medications Ordered in UC ?Medications - No data to display ? ?Initial Impression / Assessment and Plan / UC Course  ?I have reviewed the triage vital signs and the nursing notes. ? ?Pertinent labs & imaging results that were available during my care of the patient were reviewed by me and considered in my medical decision making (see chart for details). ? ?  ?Cervical radiculopathy and Shoulder blade pain  ?No imaging indicated as this is a chronic problem for patient and he has had no acute injury. Treatment today with Decadron IM given here in clinic.  Will discharge home with oral Toradol small quantity for inflammation and muscle relaxer for acute pain.  Patient advised to avoid driving while taking muscle relaxer. ?Final Clinical Impressions(s) / UC Diagnoses  ? ?Final diagnoses:  ?Cervical radiculopathy  ?Shoulder blade pain, left   ? ? ? ?Discharge Instructions   ? ?  ?You received a Decadron IM injection here in clinic today.  Start Toradol tomorrow as needed for pain.  Drink plenty of water while taking this medication.  Since you did receive a steroid injection here today your blood sugar may temporarily be elevated.  Continue to check your blood sugar.  I have also  prescribed you tizanidine to take as needed for pain however avoid driving while taking this medication. ? ? ? ?ED Prescriptions   ? ? Medication  Sig Dispense Auth. Provider  ? ketorolac (TORADOL) 10 MG tablet Take 1 tablet (10 mg total) by mouth every 12 (twelve) hours as needed. 12 tablet Bing NeighborsHarris, Cassiopeia Florentino S, FNP  ? tiZANidine (ZANAFLEX) 4 MG tablet Take 1 tablet (4 mg total) by mouth every 8 (eight) hours as needed for muscle spasms. 30 tablet Bing NeighborsHarris, Leverne Amrhein S, FNP  ? ?  ? ?PDMP not reviewed this encounter. ?  ?Bing NeighborsHarris, Pearlie Nies S, FNP ?06/08/21 1336 ? ?

## 2021-06-01 NOTE — Discharge Instructions (Signed)
You received a Decadron IM injection here in clinic today.  Start Toradol tomorrow as needed for pain.  Drink plenty of water while taking this medication.  Since you did receive a steroid injection here today your blood sugar may temporarily be elevated.  Continue to check your blood sugar.  I have also prescribed you tizanidine to take as needed for pain however avoid driving while taking this medication. ?

## 2022-05-01 NOTE — Progress Notes (Unsigned)
CARDIOLOGY CONSULT NOTE       Patient ID: Edgar Kidd MRN: [id number] DOB/AGE: 10-10-1970 52 y.o.  Admit date: (Not on file) Referring Physician: Dr Viann Fish  Primary Physician: [facility] Primary Cardiologist: New Reason for Consultation: CAD Risk  Active Problems:   * No active hospital problems. *   HPI:  52 y.o. morbidly obese male referred by Dr Tilden Dome for CAD risk No documented history of such. History of DM with A1c 8.1 HTN LDL 81 OSA GERD.  Non smoker denies ETOH abuse He is on semaglutide for weight loss and metformin OSA with prior surgery to palette and nasal passage widening Recent weight with primary 383 lbs   Lab review: Cr 1.05 K 4.4 TSH 1.97 normal LFTls TC 138 LDL 82 HDL 45 PSA 7.1 A1c 8.9 C reactive protein 7.8   Discussed literature with 33% men with some CAD with elevated C reactive protein adjusted for other risk factors RR 1.82 times if level >1   ***  ROS All other systems reviewed and negative except as noted above  Past Medical History:  Diagnosis Date   Arthritis    Diabetes mellitus    Diverticulitis    Elevated C-reactive protein (CRP)    Elevated PSA    Gastroesophagitis    GERD (gastroesophageal reflux disease)    History of kidney stones    History of vitamin D deficiency    Hypertension    Obese    Renal disorder    Sleep apnea    on CPAP     No family history on file.  Social History   Socioeconomic History   Marital status: Married    Spouse name: Not on file   Number of children: Not on file   Years of education: Not on file   Highest education level: Not on file  Occupational History   Not on file  Tobacco Use   Smoking status: Never   Smokeless tobacco: Never  Vaping Use   Vaping Use: Never used  Substance and Sexual Activity   Alcohol use: No   Drug use: No   Sexual activity: Not on file  Other Topics Concern   Not on file  Social History Narrative   Not on file   Social  Determinants of Health   Financial Resource Strain: Not on file  Food Insecurity: Not on file  Transportation Needs: Not on file  Physical Activity: Not on file  Stress: Not on file  Social Connections: Not on file  Intimate Partner Violence: Not on file    Past Surgical History:  Procedure Laterality Date   CYSTOSCOPY WITH RETROGRADE PYELOGRAM, URETEROSCOPY AND STENT PLACEMENT Left 04/22/2018   Procedure: [place], URETEROSCOPY AND STENT PLACEMENT;  Surgeon: Alexis Frock, MD;  Location: [facility];  Service: Urology;  Laterality: Left;  75 MINS   FINGER SURGERY     right little finger   HOLMIUM LASER APPLICATION Left [id number]   Procedure: HOLMIUM LASER APPLICATION;  Surgeon: Alexis Frock, MD;  Location: [facility];  Service: Urology;  Laterality: Left;   KNEE ARTHROSCOPY     ROTATOR CUFF REPAIR     TONSILLECTOMY AND ADENOIDECTOMY        Current Outpatient Medications:    amLODipine (NORVASC) 10 MG tablet, Take by mouth daily. DOSE UNKNOWN, Disp: , Rfl:    ibuprofen (ADVIL) 800 MG tablet, Take 1 tablet (800 mg total) by mouth 3 (three) times daily., Disp: 21 tablet, Rfl: 0  indapamide (LOZOL) 1.25 MG tablet, Take 1.25 mg by mouth daily., Disp: , Rfl:    ketorolac (TORADOL) 10 MG tablet, Take 1 tablet (10 mg total) by mouth every 12 (twelve) hours as needed., Disp: 12 tablet, Rfl: 0   senna-docusate (SENOKOT-S) 8.6-50 MG tablet, Take 1 tablet by mouth 2 (two) times daily. While taking strong pain meds to prevent constipation., Disp: 20 tablet, Rfl: 0   tamsulosin (FLOMAX) 0.4 MG CAPS capsule, Take 1 capsule (0.4 mg total) by mouth daily., Disp: 7 capsule, Rfl: 0   tiZANidine (ZANAFLEX) 4 MG tablet, Take 1 tablet (4 mg total) by mouth every 8 (eight) hours as needed for muscle spasms., Disp: 30 tablet, Rfl: 0   UNKNOWN TO PATIENT, HTN med, Disp: , Rfl:     Physical Exam: There were no vitals taken for this visit.   Affect appropriate Obese black male   HEENT: normal Neck supple with no adenopathy JVP normal no bruits no thyromegaly Lungs clear with no wheezing and good diaphragmatic motion Heart:  S1/S2 no murmur, no rub, gallop or click PMI normal Abdomen: benighn, BS positve, no tenderness, no AAA no bruit.  No HSM or HJR Distal pulses intact with no bruits No edema Neuro non-focal Skin warm and dry No muscular weakness   Labs:   Lab Results  Component Value Date   WBC 9.2 04/13/2018   HGB 14.1 04/13/2018   HCT 44.6 04/13/2018   MCV 89.7 04/13/2018   PLT 185 04/13/2018   No results for input(s): "NA", "K", "CL", "CO2", "BUN", "CREATININE", "CALCIUM", "PROT", "BILITOT", "ALKPHOS", "ALT", "AST", "GLUCOSE" in the last 168 hours.  Invalid input(s): "LABALBU" Lab Results  Component Value Date   CKTOTAL 896 (H) 09/30/2009   CKMB 2.0 09/30/2009   TROPONINI 0.02        NO INDICATION OF MYOCARDIAL INJURY. 09/30/2009    Lab Results  Component Value Date   CHOL  09/30/2009    85        ATP III CLASSIFICATION:  <200     mg/dL   Desirable  200-239  mg/dL   Borderline High  >=240    mg/dL   High          CHOL  05/03/2008    133        ATP III CLASSIFICATION:  <200     mg/dL   Desirable  200-239  mg/dL   Borderline High  >=240    mg/dL   High          Lab Results  Component Value Date   HDL 33 (L) 09/30/2009   HDL 32 (L) 05/03/2008   Lab Results  Component Value Date   [facility]  09/30/2009    35        Total Cholesterol/HDL:CHD Risk Coronary Heart Disease Risk Table                     Men   Women  1/2 Average Risk   3.4   3.3  Average Risk       5.0   4.4  2 X Average Risk   9.6   7.1  3 X Average Risk  23.4   11.0        Use the calculated Patient Ratio above and the CHD Risk Table to determine the patient's CHD Risk.        ATP III CLASSIFICATION (LDL):  <100     mg/dL   Optimal  100-129  mg/dL   Near or Above                    Optimal  130-159  mg/dL   Borderline  160-189  mg/dL   High  >190      mg/dL   Very High   LDLCALC  05/03/2008    95        Total Cholesterol/HDL:CHD Risk Coronary Heart Disease Risk Table                     Men   Women  1/2 Average Risk   3.4   3.3  Average Risk       5.0   4.4  2 X Average Risk   9.6   7.1  3 X Average Risk  23.4   11.0        Use the calculated Patient Ratio above and the CHD Risk Table to determine the patient's CHD Risk.        ATP III CLASSIFICATION (LDL):  <100     mg/dL   Optimal  100-129  mg/dL   Near or Above                    Optimal  130-159  mg/dL   Borderline  160-189  mg/dL   High  >190     mg/dL   Very High   Lab Results  Component Value Date   TRIG 84 09/30/2009   TRIG 30 05/03/2008   Lab Results  Component Value Date   CHOLHDL 2.6 09/30/2009   CHOLHDL 4.2 05/03/2008   No results found for: "LDLDIRECT"    Radiology: No results found.  EKG: ***   ASSESSMENT AND PLAN:   CAD:  risk poorly controlled DM and elevated CRP Latter can be seen soley from poorly controlled DM and metabolic syndrome  *** DM:  Discussed low carb diet.  Target hemoglobin A1c is 6.5 or less.  Continue current medications. Elevated PSA:  f/u primary r/o infection consider referral to urology  ***  Calcium score  ***  F/U cardiology PRN pending test results   Signed: Jenkins Rouge 05/06/2022, 5:53 PM

## 2022-05-07 ENCOUNTER — Encounter: Payer: Self-pay | Admitting: Cardiovascular Disease

## 2022-05-07 ENCOUNTER — Ambulatory Visit: Payer: 59 | Attending: Cardiovascular Disease | Admitting: Cardiovascular Disease

## 2022-05-07 VITALS — BP 146/98 | HR 111 | Ht 72.0 in | Wt 376.0 lb

## 2022-05-07 DIAGNOSIS — E782 Mixed hyperlipidemia: Secondary | ICD-10-CM | POA: Diagnosis not present

## 2022-05-07 DIAGNOSIS — I2510 Atherosclerotic heart disease of native coronary artery without angina pectoris: Secondary | ICD-10-CM | POA: Diagnosis not present

## 2022-05-07 DIAGNOSIS — I2583 Coronary atherosclerosis due to lipid rich plaque: Secondary | ICD-10-CM | POA: Diagnosis not present

## 2022-05-07 DIAGNOSIS — I10 Essential (primary) hypertension: Secondary | ICD-10-CM

## 2022-05-07 DIAGNOSIS — R000 Tachycardia, unspecified: Secondary | ICD-10-CM

## 2022-05-07 DIAGNOSIS — R9431 Abnormal electrocardiogram [ECG] [EKG]: Secondary | ICD-10-CM | POA: Diagnosis not present

## 2022-05-07 DIAGNOSIS — E118 Type 2 diabetes mellitus with unspecified complications: Secondary | ICD-10-CM

## 2022-05-07 MED ORDER — ATORVASTATIN CALCIUM 10 MG PO TABS: 10.0000 mg | ORAL_TABLET | Freq: Every day | ORAL | 3 refills | Status: DC

## 2022-05-07 MED ORDER — PROPRANOLOL-HCTZ 40-25 MG PO TABS: 1.0000 | ORAL_TABLET | Freq: Every day | ORAL | 3 refills | Status: DC

## 2022-05-07 NOTE — Patient Instructions (Signed)
Medication Instructions:  Your physician has recommended you make the following change in your medication:  1-START Inderide 40/25 mg by mouth daily 1-START Lipitor  10 mg by mouth daily.  *If you need a refill on your cardiac medications before your next appointment, please call your pharmacy*  Lab Work: If you have labs (blood work) drawn today and your tests are completely normal, you will receive your results only by: [place] (if you have MyChart) OR A paper copy in the mail If you have any lab test that is abnormal or we need to change your treatment, we will call you to review the results.  Testing/Procedures: Your physician has requested that you have an echocardiogram. Echocardiography is a painless test that uses sound waves to create images of your heart. It provides your doctor with information about the size and shape of your heart and how well your heart's chambers and valves are working. This procedure takes approximately one hour. There are no restrictions for this procedure. Please do NOT wear cologne, perfume, aftershave, or lotions (deodorant is allowed). Please arrive 15 minutes prior to your appointment time.  Cardiac CT scanning for calcium score (CAT scanning), is a noninvasive, special x-ray that produces cross-sectional images of the body using x-rays and a computer. CT scans help physicians diagnose and treat medical conditions. For some CT exams, a contrast material is used to enhance visibility in the area of the body being studied. CT scans provide greater clarity and reveal more details than regular x-ray exams.   Follow-Up: At [facility], you and your health needs are our priority.  As part of our continuing mission to provide you with exceptional heart care, we have created designated Provider Care Teams.  These Care Teams include your primary Cardiologist (physician) and Advanced Practice Providers (APPs -  Physician Assistants and Nurse  Practitioners) who all work together to provide you with the care you need, when you need it.  We recommend signing up for the patient portal called "MyChart".  Sign up information is provided on this After Visit Summary.  MyChart is used to connect with patients for Virtual Visits (Telemedicine).  Patients are able to view lab/test results, encounter notes, upcoming appointments, etc.  Non-urgent messages can be sent to your provider as well.   To learn more about what you can do with MyChart, go to [email or web address].    Your next appointment:   6 to 8 week(s)  Provider:   Jenkins Rouge, MD

## 2022-06-05 ENCOUNTER — Telehealth: Payer: Self-pay | Admitting: Cardiovascular Disease

## 2022-06-05 ENCOUNTER — Ambulatory Visit (HOSPITAL_COMMUNITY): Payer: 59 | Attending: Cardiovascular Disease

## 2022-06-05 DIAGNOSIS — I2583 Coronary atherosclerosis due to lipid rich plaque: Secondary | ICD-10-CM | POA: Diagnosis present

## 2022-06-05 DIAGNOSIS — I2510 Atherosclerotic heart disease of native coronary artery without angina pectoris: Secondary | ICD-10-CM | POA: Diagnosis not present

## 2022-06-05 DIAGNOSIS — I712 Thoracic aortic aneurysm, without rupture, unspecified: Secondary | ICD-10-CM

## 2022-06-05 LAB — ECHOCARDIOGRAM COMPLETE
Area-P 1/2: 6.02 cm2
S' Lateral: 2.9 cm

## 2022-06-05 MED ORDER — PERFLUTREN LIPID MICROSPHERE
1.0000 mL | INTRAVENOUS | Status: AC | PRN
  Administered 2022-06-05: 2 mL via INTRAVENOUS

## 2022-06-05 NOTE — Telephone Encounter (Signed)
Pt came in today asking if his blood pressure meds (inderide) could be changed. They are on back order and still has not started the medication.

## 2022-06-05 NOTE — Telephone Encounter (Signed)
Called patient's pharmacy. Since Inderide (propranolol/HCTZ ) 40/25 mg is on backorder, we will have patient take propranolol 40 mg and HCTZ 25 mg, until they get Inderide back in stock. Called patient and let him know that he would have to take two pills for now to equal the one. Informed patient that once Inderide is back in stock he will get back the combination pill.

## 2022-06-13 ENCOUNTER — Other Ambulatory Visit (HOSPITAL_BASED_OUTPATIENT_CLINIC_OR_DEPARTMENT_OTHER): Payer: 59

## 2022-06-17 ENCOUNTER — Ambulatory Visit (HOSPITAL_BASED_OUTPATIENT_CLINIC_OR_DEPARTMENT_OTHER)
Admission: RE | Admit: 2022-06-17 | Discharge: 2022-06-17 | Disposition: A | Discharge status: Home / Self Care | Payer: 59 | Source: Ambulatory Visit | Attending: Cardiovascular Disease | Admitting: Cardiovascular Disease

## 2022-06-17 DIAGNOSIS — I2510 Atherosclerotic heart disease of native coronary artery without angina pectoris: Secondary | ICD-10-CM

## 2022-06-17 DIAGNOSIS — I2583 Coronary atherosclerosis due to lipid rich plaque: Secondary | ICD-10-CM | POA: Insufficient documentation

## 2022-06-17 NOTE — Telephone Encounter (Signed)
Called patient about CT calcium score results. Patient will have gated chest CTA in 6 months.

## 2022-06-17 NOTE — Telephone Encounter (Signed)
-----   Message from Wendall Stade, MD sent at 06/17/2022 10:48 AM EDT ----- Calcium score 0 great Has aortic aneurysm 4.2 cm Order gated chest CTA in 6 months

## 2022-06-28 ENCOUNTER — Ambulatory Visit (HOSPITAL_COMMUNITY)
Admission: EM | Admit: 2022-06-28 | Discharge: 2022-06-28 | Disposition: A | Discharge status: Home / Self Care | Payer: 59 | Attending: Emergency Medicine | Admitting: Emergency Medicine

## 2022-06-28 ENCOUNTER — Encounter (HOSPITAL_COMMUNITY): Payer: Self-pay

## 2022-06-28 DIAGNOSIS — M5440 Lumbago with sciatica, unspecified side: Secondary | ICD-10-CM | POA: Diagnosis not present

## 2022-06-28 MED ORDER — METHYLPREDNISOLONE SODIUM SUCC 125 MG IJ SOLR
80.0000 mg | Freq: Once | INTRAMUSCULAR | Status: AC
  Administered 2022-06-28: 80 mg via INTRAMUSCULAR

## 2022-06-28 MED ORDER — NAPROXEN 500 MG PO TABS: 500.0000 mg | ORAL_TABLET | Freq: Two times a day (BID) | ORAL | 0 refills | Status: AC

## 2022-06-28 MED ORDER — METHOCARBAMOL 500 MG PO TABS: 500.0000 mg | ORAL_TABLET | Freq: Two times a day (BID) | ORAL | 0 refills | Status: DC

## 2022-06-28 MED ORDER — METHYLPREDNISOLONE SODIUM SUCC 125 MG IJ SOLR
INTRAMUSCULAR | Status: AC
  Filled 2022-06-28: qty 2

## 2022-06-28 NOTE — Discharge Instructions (Addendum)
Your symptoms are consistent with sciatica.  We have given you a steroid injection in clinic, this may temporarily raise your blood sugars.  You can take the muscle relaxer up to 2 times daily as needed, do not drink or drive on this medication as it may cause drowsiness.  Also please start the anti-inflammatory naproxen today, take this twice daily, you can take it with food.  Please follow-up with your orthopedist at [facility] for further evaluation.  Please seek immediate care if you develop loss of bowel or bladder function, fever, unable to walk, or any new concerning symptoms.

## 2022-06-28 NOTE — ED Triage Notes (Signed)
Pt c/o lower back pain radiating down lt leg with numbness for over a month and now getting more constant. Denies injury. States taking ibuprofen with no relief.
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
## 2022-07-09 NOTE — Progress Notes (Signed)
CARDIOLOGY CONSULT NOTE       Patient ID: Edgar Kidd MRN: [id number] DOB/AGE: 54-24-1972 54 y.o.  Admit date: (Not on file) Referring Physician: Dr Veneda Melter  Primary Physician: [facility] Primary Cardiologist: Eden Emms   HPI:  52 y.o. morbidly obese male referred by Dr Steele Berg for CAD risk first seen by me 05/07/22  No documented history of such. History of DM with A1c 8.1 HTN LDL 81 OSA GERD.  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 Has some paraesthesias in left shoulder but no chest pain Pulse was > 100 in office and ECG with LVH    His BP is up and he stopped taking his Lozol He is not on statin He has only taking glucophage 850 mg for DM and considering Ozempic for this and weight loss    Calcium score done 06/20/22 was 0 Noted 4.2 cm ascending thoracic aorta  To have gated chest CTA in October  Has one son doing Avionics and has a 37 yo One daughter working at daycare and going back to school they are 28 and 23   Having prostate biopsy Monday  ROS All other systems reviewed and negative except as noted above  Past Medical History:  Diagnosis Date   Arthritis    Diabetes mellitus    Diverticulitis    Elevated C-reactive protein (CRP)    Elevated PSA    Gastroesophagitis    GERD (gastroesophageal reflux disease)    History of kidney stones    History of vitamin D deficiency    Hypertension    Obese    Renal disorder    Sleep apnea    on CPAP     No family history on file.  Social History    Socioeconomic History   Marital status: Married    Spouse name: Not on file   Number of children: Not on file   Years of education: Not on file   Highest education level: Not on file  Occupational History   Not on file  Tobacco Use   Smoking status: Never   Smokeless tobacco: Never  Vaping Use   Vaping Use: Never used  Substance and Sexual Activity   Alcohol use: No   Drug use: No   Sexual activity: Not on file  Other Topics Concern   Not on file  Social History Narrative   Not on file   Social Determinants of Health   Financial Resource Strain: Not on file  Food Insecurity: Not on file  Transportation Needs: Not on file  Physical Activity: Not on file  Stress: Not on file  Social Connections: Not on file  Intimate Partner Violence: Not on file    Past Surgical History:  Procedure Laterality Date   CYSTOSCOPY WITH RETROGRADE PYELOGRAM, URETEROSCOPY AND STENT PLACEMENT Left 04/22/2018   Procedure: CYSTOSCOPY WITH RETROGRADE PYELOGRAM, URETEROSCOPY AND STENT PLACEMENT;  Surgeon: Sebastian Ache, MD;  Location: [facility];  Service: Urology;  Laterality: Left;  75 MINS   FINGER SURGERY     right little finger   HOLMIUM LASER APPLICATION Left 04/22/2018   Procedure: HOLMIUM LASER APPLICATION;  Surgeon: Sebastian Ache, MD;  Location: [facility];  Service: Urology;  Laterality: Left;   KNEE ARTHROSCOPY     ROTATOR CUFF REPAIR     TONSILLECTOMY AND ADENOIDECTOMY        Current Outpatient Medications:    atorvastatin (LIPITOR) 10 MG tablet, Take 1 tablet (10 mg total) by mouth daily., Disp: 90 tablet, Rfl: 3   ibuprofen (ADVIL) 800 MG tablet, Take 800 mg by mouth every 8 (eight) hours as needed., Disp: , Rfl:    metFORMIN (GLUCOPHAGE) 850 MG tablet, Take 1 tablet by mouth 2 (two) times daily., Disp: , Rfl:    methocarbamol (ROBAXIN) 500 MG tablet, Take 1 tablet (500 mg total) by mouth 2 (two) times daily., Disp: 20 tablet, Rfl: 0   propranolol-hydrochlorothiazide (INDERIDE)  40-25 MG tablet, Take 1 tablet by mouth daily., Disp: 90 tablet, Rfl: 3    Physical Exam: There were no vitals taken for this visit.   Affect appropriate Obese black male  HEENT: normal Neck supple with no adenopathy JVP normal no bruits no thyromegaly Lungs clear with no wheezing and good diaphragmatic motion Heart:  S1/S2 no murmur, no rub, gallop or click PMI normal Abdomen: benighn, BS positve, no tenderness, no AAA no bruit.  No HSM or HJR Distal pulses intact with no bruits Plus 1-2 bilateral  edema Neuro non-focal Skin warm and dry Arthritis both knees    Labs:   Lab Results  Component Value Date   WBC 9.2 04/13/2018   HGB 14.1 04/13/2018   HCT 44.6 04/13/2018   MCV 89.7 04/13/2018   PLT 185 04/13/2018   No results for input(s): "NA", "K", "CL", "CO2", "BUN", "CREATININE", "CALCIUM", "PROT", "BILITOT", "ALKPHOS", "ALT", "AST", "GLUCOSE" in the last 168 hours.  Invalid input(s): "LABALBU" Lab Results  Component Value Date   CKTOTAL 896 (H) 09/30/2009   CKMB 2.0 09/30/2009   TROPONINI 0.02        NO INDICATION OF MYOCARDIAL INJURY. 09/30/2009    Lab Results  Component Value Date   CHOL  09/30/2009    85        ATP III CLASSIFICATION:  <200     mg/dL   Desirable  161-096  mg/dL   Borderline High  >=045    mg/dL   High          CHOL  40/98/1191    133        ATP III CLASSIFICATION:  <200     mg/dL   Desirable  478-295  mg/dL   Borderline High  >=621    mg/dL   High          Lab Results  Component Value Date   HDL 33 (L) 09/30/2009   HDL 32 (L) 05/03/2008   Lab Results  Component Value Date   LDLCALC  09/30/2009    35        Total Cholesterol/HDL:CHD Risk Coronary Heart Disease Risk Table                     Men   Women  1/2 Average Risk   3.4   3.3  Average Risk       5.0   4.4  2 X Average Risk   9.6   7.1  3 X Average Risk  23.4   11.0  Use the calculated Patient Ratio above and the CHD Risk Table to determine the patient's  CHD Risk.        ATP III CLASSIFICATION (LDL):  <100     mg/dL   Optimal  409-811  mg/dL   Near or Above                    Optimal  130-159  mg/dL   Borderline  914-782  mg/dL   High  >956     mg/dL   Very High   LDLCALC  05/03/2008    95        Total Cholesterol/HDL:CHD Risk Coronary Heart Disease Risk Table                     Men   Women  1/2 Average Risk   3.4   3.3  Average Risk       5.0   4.4  2 X Average Risk   9.6   7.1  3 X Average Risk  23.4   11.0        Use the calculated Patient Ratio above and the CHD Risk Table to determine the patient's CHD Risk.        ATP III CLASSIFICATION (LDL):  <100     mg/dL   Optimal  213-086  mg/dL   Near or Above                    Optimal  130-159  mg/dL   Borderline  578-469  mg/dL   High  >629     mg/dL   Very High   Lab Results  Component Value Date   TRIG 84 09/30/2009   TRIG 30 05/03/2008   Lab Results  Component Value Date   CHOLHDL 2.6 09/30/2009   CHOLHDL 4.2 05/03/2008   No results found for: "LDLDIRECT"    Radiology: CT CARDIAC SCORING (SELF PAY ONLY)  Addendum Date: 06/20/2022   ADDENDUM REPORT: 06/20/2022 10:57 ADDENDUM: OVER-READ INTERPRETATION  CT CHEST The following report is an over-read performed by radiologist Dr. Oley Balm of [facility]. This over-read does not include interpretation of cardiac or coronary anatomy or pathology; that interpretation by cardiologist is attached. No pleural or pericardial effusion. No mass or adenopathy in visualized mediastinum. Visualized upper abdomen unremarkable. No pneumothorax. 3 mm subpleural nodule anterior left upper lobe (Im1,Se2) stable since 09/30/2009 consistent with benign process. 3 mm calcified benign granuloma in the right middle lobe. Lungs otherwise clear. Bridging osteophytes throughout the visualized thoracic spine. IMPRESSION: 1. No acute findings. Electronically Signed   By: Corlis Leak M.D.   On: 06/20/2022 10:57   Result Date:  06/20/2022 CLINICAL DATA:  Risk stratification EXAM: Coronary Calcium Score TECHNIQUE: The patient was scanned on a Siemens Somatom 64 slice scanner. Axial non-contrast 3mm slices were carried out through the heart. The data set was analyzed on a dedicated work station and scored using the Agatson method. FINDINGS: Non-cardiac: No significant non cardiac findings on limited lung and soft tissue windows. See separate report from [facility]. Ascending Aorta: Dilated with diameter 4.2 cm Pericardium: Normal Coronary arteries: No calcium noted IMPRESSION: Coronary calcium score of 0.  Dilated ascending thoracic aorta Charlton Haws Electronically Signed: By: Charlton Haws M.D. On: 06/17/2022 10:30    EKG: ST rate 111 LVH and LAD    ASSESSMENT AND PLAN:   CAD:  risk poorly controlled DM and elevated CRP Latter can  be seen soley from poorly controlled DM and metabolic syndrome  His body habitus makes non invasive imaging difficult With calcium score of 0 no further w/u needed  DM:  Discussed low carb diet.  Target hemoglobin A1c is 6.5 or less.  Needs more intense Rx not just glucophage F/U primary consider starting Jardiance or Ozempic or both  Elevated PSA:  f/u has seen urology 3/25 biopsy pending for next week Tachycardia/HTN:  Improved with inderide.  TTE 06/05/22 EF 70-75% mild LVH  Dilated Aorta:  ? From HTN 4.2 cm on calcium score and 4.3 cm on echo CTA gated chest October 2024  CTA gated chest October 2024  BMET prior to getting contrast  F/U cardiology in a year  Signed: Charlton Haws 07/09/2022, 10:32 AM

## 2022-07-18 ENCOUNTER — Ambulatory Visit: Payer: 59 | Attending: Cardiovascular Disease | Admitting: Cardiovascular Disease

## 2022-07-18 ENCOUNTER — Encounter: Payer: Self-pay | Admitting: Cardiovascular Disease

## 2022-07-18 VITALS — BP 110/70 | HR 80 | Ht 72.0 in | Wt 376.6 lb

## 2022-07-18 DIAGNOSIS — I77819 Aortic ectasia, unspecified site: Secondary | ICD-10-CM

## 2022-07-18 NOTE — Patient Instructions (Signed)
Medication Instructions:  Your physician recommends that you continue on your current medications as directed. Please refer to the Current Medication list given to you today.  *If you need a refill on your cardiac medications before your next appointment, please call your pharmacy*  Lab Work: Your physician recommends that you return for lab work in: September for BMET If you have labs (blood work) drawn today and your tests are completely normal, you will receive your results only by: [employer] (if you have MyChart) OR A paper copy in the mail If you have any lab test that is abnormal or we need to change your treatment, we will call you to review the results.   Testing/Procedures: CT Angiography (CTA) of chest, is a special type of CT scan that uses a computer to produce multi-dimensional views of major blood vessels throughout the body. In CT angiography, a contrast material is injected through an IV to help visualize the blood vessels  Follow-Up: At [facility], you and your health needs are our priority.  As part of our continuing mission to provide you with exceptional heart care, we have created designated Provider Care Teams.  These Care Teams include your primary Cardiologist (physician) and Advanced Practice Providers (APPs -  Physician Assistants and Nurse Practitioners) who all work together to provide you with the care you need, when you need it.  We recommend signing up for the patient portal called "MyChart".  Sign up information is provided on this After Visit Summary.  MyChart is used to connect with patients for Virtual Visits (Telemedicine).  Patients are able to view lab/test results, encounter notes, upcoming appointments, etc.  Non-urgent messages can be sent to your provider as well.   To learn more about what you can do with MyChart, go to [email or web address].    Your next appointment:   1 year(s)  Provider:   Charlton Haws, MD

## 2022-07-30 ENCOUNTER — Other Ambulatory Visit: Payer: Self-pay | Admitting: Urology

## 2022-09-12 ENCOUNTER — Encounter (HOSPITAL_COMMUNITY): Payer: Self-pay

## 2022-09-12 NOTE — Progress Notes (Addendum)
COVID Vaccine Completed:  Date of COVID positive in last 90 days:  No  PCP - Nichole Hinson Cardiologist - Charlton Haws, MD  Chest x-ray -  N/A EKG - 05-07-22 Epic Stress Test - Yes, several years ago ECHO - 06-05-22 Epic Cardiac Cath - Yes, several years ago Pacemaker/ICD device last checked: Spinal Cord Stimulator: Cardiac CT - 06-17-22 Epic  Bowel Prep -  Follow a clear liquid diet the day before surgery and  Magnesium Citrate - drink one bottle at noon the day before surgery Patient has instructions  Sleep Study - Yes, +sleep apnea CPAP - Yes  Fasting Blood Sugar - 100 to 160 Checks Blood Sugar - 1 time a day  Last dose of GLP1 agonist-  N/A GLP1 instructions:  N/A   Last dose of SGLT-2 inhibitors-  N/A SGLT-2 instructions: N/A   Blood Thinner Instructions:  N/A Aspirin Instructions: Last Dose:  Activity level:  Can go up a flight of stairs and perform activities of daily living without stopping and without symptoms of chest pain or shortness of breath.  Anesthesia review:  CAD, mild dilation aortic root, HTN, DM, OSA on CPAP  Patient denies shortness of breath, fever, cough and chest pain at PAT appointment  Patient verbalized understanding of instructions that were given to them at the PAT appointment. Patient was also instructed that they will need to review over the PAT instructions again at home before surgery.

## 2022-09-12 NOTE — Patient Instructions (Addendum)
SURGICAL WAITING ROOM VISITATION Patients having surgery or a procedure may have no more than 2 support people in the waiting area - these visitors may rotate.    Children under the age of 89 must have an adult with them who is not the patient.  If the patient needs to stay at the hospital during part of their recovery, the visitor guidelines for inpatient rooms apply. Pre-op nurse will coordinate an appropriate time for 1 support person to accompany patient in pre-op.  This support person may not rotate.    Please refer to the [facility] website for the visitor guidelines for Inpatients (after your surgery is over and you are in a regular room).       Your procedure is scheduled on: 09-18-22   Report to [facility] Main Entrance    Report to admitting at 6:15 AM   Call this number if you have problems the morning of surgery [phone]   Follow a clear liquid diet the day before surgery    Do not eat food or drink liquids:After Midnight.           If you have questions, please contact your surgeon's office.   FOLLOW BOWEL PREP AND ANY ADDITIONAL PRE OP INSTRUCTIONS YOU RECEIVED FROM YOUR SURGEON'S OFFICE!!!    - Follow a clear liquid diet the day before surgery    -Magnesium Citrate - drink one bottle at noon the day before surgery  Oral Hygiene is also important to reduce your risk of infection.                                    Remember - BRUSH YOUR TEETH THE MORNING OF SURGERY WITH YOUR REGULAR TOOTHPASTE   Do NOT smoke after Midnight   Take these medicines the morning of surgery with A SIP OF WATER:   Atorvastatin  Propranolol   Stop all vitamins and herbal supplements 7 days before surgery  How to Manage Your Diabetes Before and After Surgery  Why is it important to control my blood sugar before and after surgery? Improving blood sugar levels before and after surgery helps healing and can limit problems. A way of improving blood sugar control is eating a  healthy diet by:  Eating less sugar and carbohydrates  Increasing activity/exercise  Talking with your doctor about reaching your blood sugar goals High blood sugars (greater than 180 mg/dL) can raise your risk of infections and slow your recovery, so you will need to focus on controlling your diabetes during the weeks before surgery. Make sure that the doctor who takes care of your diabetes knows about your planned surgery including the date and location.  How do I manage my blood sugar before surgery? Check your blood sugar at least 4 times a day, starting 2 days before surgery, to make sure that the level is not too high or low. Check your blood sugar the morning of your surgery when you wake up and every 2 hours until you get to the Short Stay unit. If your blood sugar is less than 70 mg/dL, you will need to treat for low blood sugar: Do not take insulin. Treat a low blood sugar (less than 70 mg/dL) with  cup of clear juice (cranberry or apple), 4 glucose tablets, OR glucose gel. Recheck blood sugar in 15 minutes after treatment (to make sure it is greater than 70 mg/dL). If your blood  sugar is not greater than 70 mg/dL on recheck, call [phone] for further instructions. Report your blood sugar to the short stay nurse when you get to Short Stay.  If you are admitted to the hospital after surgery: Your blood sugar will be checked by the staff and you will probably be given insulin after surgery (instead of oral diabetes medicines) to make sure you have good blood sugar levels. The goal for blood sugar control after surgery is 80-180 mg/dL.   WHAT DO I DO ABOUT MY DIABETES MEDICATION?  Do not take oral diabetes medicines (pills) the morning of surgery (do not take Metformin the morning of surgery)  DO NOT TAKE THE FOLLOWING 7 DAYS PRIOR TO SURGERY: Ozempic, Wegovy, Rybelsus (Semaglutide), Byetta (exenatide), Bydureon (exenatide ER), Victoza, Saxenda (liraglutide), or Trulicity  (dulaglutide) Mounjaro (Tirzepatide) Adlyxin (Lixisenatide), Polyethylene Glycol Loxenatide.   Reviewed and Endorsed by [facility] Patient Education Committee, August 2015  Bring CPAP mask and tubing day of surgery.                              You may not have any metal on your body including jewelry, and body piercing             Do not wear  lotions, powders, cologne, or deodorant              Men may shave face and neck.   Do not bring valuables to the hospital. [facility] IS NOT RESPONSIBLE   FOR VALUABLES.   Contacts, dentures or bridgework may not be worn into surgery.   Bring small overnight bag day of surgery.   DO NOT BRING YOUR HOME MEDICATIONS TO THE HOSPITAL. PHARMACY WILL DISPENSE MEDICATIONS LISTED ON YOUR MEDICATION LIST TO YOU DURING YOUR ADMISSION IN THE HOSPITAL!              Please read over the following fact sheets you were given: IF YOU HAVE QUESTIONS ABOUT YOUR PRE-OP INSTRUCTIONS PLEASE CALL [phone] Gwen  If you received a COVID test during your pre-op visit  it is requested that you wear a mask when out in public, stay away from anyone that may not be feeling well and notify your surgeon if you develop symptoms. If you test positive for Covid or have been in contact with anyone that has tested positive in the last 10 days please notify you surgeon.  [facility] - Preparing for Surgery Before surgery, you can play an important role.  Because skin is not sterile, your skin needs to be as free of germs as possible.  You can reduce the number of germs on your skin by washing with CHG (chlorahexidine gluconate) soap before surgery.  CHG is an antiseptic cleaner which kills germs and bonds with the skin to continue killing germs even after washing. Please DO NOT use if you have an allergy to CHG or antibacterial soaps.  If your skin becomes reddened/irritated stop using the CHG and inform your nurse when you arrive at Short Stay. Do not shave (including legs  and underarms) for at least 48 hours prior to the first CHG shower.  You may shave your face/neck.  Please follow these instructions carefully:  1.  Shower with CHG Soap the night before surgery and the  morning of surgery.  2.  If you choose to wash your hair, wash your hair first as usual with your normal  shampoo.  3.  After you shampoo, rinse your hair and body thoroughly to remove the shampoo.                             4.  Use CHG as you would any other liquid soap.  You can apply chg directly to the skin and wash.  Gently with a scrungie or clean washcloth.  5.  Apply the CHG Soap to your body ONLY FROM THE NECK DOWN.   Do   not use on face/ open                           Wound or open sores. Avoid contact with eyes, ears mouth and   genitals (private parts).                       Wash face,  Genitals (private parts) with your normal soap.             6.  Wash thoroughly, paying special attention to the area where your    surgery  will be performed.  7.  Thoroughly rinse your body with warm water from the neck down.  8.  DO NOT shower/wash with your normal soap after using and rinsing off the CHG Soap.                9.  Pat yourself dry with a clean towel.            10.  Wear clean pajamas.            11.  Place clean sheets on your bed the night of your first shower and do not  sleep with pets. Day of Surgery : Do not apply any lotions/deodorants the morning of surgery.  Please wear clean clothes to the hospital/surgery center.  FAILURE TO FOLLOW THESE INSTRUCTIONS MAY RESULT IN THE CANCELLATION OF YOUR SURGERY  PATIENT SIGNATURE_________________________________  NURSE SIGNATURE__________________________________  ________________________________________________________________________

## 2022-09-13 ENCOUNTER — Other Ambulatory Visit: Payer: Self-pay

## 2022-09-13 ENCOUNTER — Encounter (HOSPITAL_COMMUNITY): Payer: Self-pay

## 2022-09-13 ENCOUNTER — Encounter (HOSPITAL_COMMUNITY)
Admission: RE | Admit: 2022-09-13 | Discharge: 2022-09-13 | Disposition: A | Discharge status: Home / Self Care | Payer: 59 | Source: Ambulatory Visit | Attending: Urology | Admitting: Urology

## 2022-09-13 VITALS — BP 140/99 | HR 80 | Temp 98.4°F | Resp 16 | Ht 72.0 in | Wt 376.6 lb

## 2022-09-13 DIAGNOSIS — Z01812 Encounter for preprocedural laboratory examination: Secondary | ICD-10-CM | POA: Insufficient documentation

## 2022-09-13 DIAGNOSIS — E119 Type 2 diabetes mellitus without complications: Secondary | ICD-10-CM | POA: Diagnosis not present

## 2022-09-13 DIAGNOSIS — C61 Malignant neoplasm of prostate: Secondary | ICD-10-CM | POA: Diagnosis not present

## 2022-09-13 DIAGNOSIS — Z7984 Long term (current) use of oral hypoglycemic drugs: Secondary | ICD-10-CM | POA: Diagnosis not present

## 2022-09-13 DIAGNOSIS — I10 Essential (primary) hypertension: Secondary | ICD-10-CM | POA: Insufficient documentation

## 2022-09-13 DIAGNOSIS — Z6841 Body Mass Index (BMI) 40.0 and over, adult: Secondary | ICD-10-CM | POA: Diagnosis not present

## 2022-09-13 DIAGNOSIS — G4733 Obstructive sleep apnea (adult) (pediatric): Secondary | ICD-10-CM | POA: Insufficient documentation

## 2022-09-13 DIAGNOSIS — Z01818 Encounter for other preprocedural examination: Secondary | ICD-10-CM

## 2022-09-13 HISTORY — DX: Malignant neoplasm of prostate: C61

## 2022-09-13 LAB — BASIC METABOLIC PANEL
Anion gap: 11 (ref 5–15)
BUN: 15 mg/dL (ref 6–20)
CO2: 26 mmol/L (ref 22–32)
Calcium: 9.6 mg/dL (ref 8.9–10.3)
Chloride: 101 mmol/L (ref 98–111)
Creatinine, Ser: 1.03 mg/dL (ref 0.61–1.24)
GFR, Estimated: >60 mL/min (ref >60)
Glucose, Bld: 180 mg/dL — ABNORMAL HIGH (ref 70–99)
Potassium: 4.3 mmol/L (ref 3.5–5.1)
Sodium: 138 mmol/L (ref 135–145)

## 2022-09-13 LAB — CBC
HCT: 44.2 % (ref 39.0–52.0)
Hemoglobin: 14.9 g/dL (ref 13.0–17.0)
MCH: 30 pg (ref 26.0–34.0)
MCHC: 33.7 g/dL (ref 30.0–36.0)
MCV: 88.9 fL (ref 80.0–100.0)
Platelets: 189 10*3/uL (ref 150–400)
RBC: 4.97 MIL/uL (ref 4.22–5.81)
RDW: 13.6 % (ref 11.5–15.5)
WBC: 10.8 10*3/uL — ABNORMAL HIGH (ref 4.0–10.5)
nRBC: 0 % (ref 0.0–0.2)

## 2022-09-13 LAB — HEMOGLOBIN A1C
Hgb A1c MFr Bld: 10.1 % — ABNORMAL HIGH (ref 4.8–5.6)
Mean Plasma Glucose: 243.17 mg/dL

## 2022-09-13 LAB — GLUCOSE, CAPILLARY: Glucose-Capillary: 179 mg/dL — ABNORMAL HIGH (ref 70–99)

## 2022-09-16 ENCOUNTER — Encounter (HOSPITAL_COMMUNITY): Payer: Self-pay

## 2022-09-16 NOTE — Anesthesia Preprocedure Evaluation (Signed)
Anesthesia Evaluation    Airway        Dental   Pulmonary           Cardiovascular hypertension,      Neuro/Psych    GI/Hepatic   Endo/Other  diabetes    Renal/GU      Musculoskeletal   Abdominal   Peds  Hematology   Anesthesia Other Findings   Reproductive/Obstetrics                              Anesthesia Physical Anesthesia Plan  ASA:   Anesthesia Plan:    Post-op Pain Management:    Induction:   PONV Risk Score and Plan:   Airway Management Planned:   Additional Equipment:   Intra-op Plan:   Post-operative Plan:   Informed Consent:   Plan Discussed with:   Anesthesia Plan Comments: (See PAT note from 8/2 by Sherlie Ban PA-C )         Anesthesia Quick Evaluation

## 2022-09-16 NOTE — Progress Notes (Signed)
Choose an anesthesia record to view details        DISCUSSION: Edgar Kidd is a 52 yo male who presents to PAT prior to prostatectomy. PMH significant for prostate cancer, morbid obesity, uncontrolled DM, HTN, OSA (on CPAP), GERD, arthritis, TAA.  No prior anesthesia complications.  Patient was referred to Cardiology in March of 2024 to evaluate for CAD. Coronary calcium score was ordered and results were "0" therefore no further w/u recommended. Incidentally a TAA was noted of 4.3cm. Repeat CTA ordered in October 2024. Started on Inderide for BP and HR control.  Patient follow with PCP for diabetes which is not well controlled. HgA1c is 10.1. Results relayed to [facility].  VS: BP (!) 140/99   Pulse 80   Temp 36.9 C (Oral)   Resp 16   Ht 6' (1.829 m)   Wt (!) 170.8 kg   SpO2 98%   BMI 51.08 kg/m   PROVIDERS: [facility]: Charlton Haws, MD  LABS:  Results of HgA1c relayed to [facility]. Otherwise labs WNL (all labs ordered are listed, but only abnormal results are displayed)  Labs Reviewed  HEMOGLOBIN A1C - Abnormal; Notable for the following components:      Result Value   Hgb A1c MFr Bld 10.1 (*)    All other components within normal limits  BASIC METABOLIC PANEL - Abnormal; Notable for the following components:   Glucose, Bld 180 (*)    All other components within normal limits  CBC - Abnormal; Notable for the following components:   WBC 10.8 (*)    All other components within normal limits  GLUCOSE, CAPILLARY - Abnormal; Notable for the following components:   Glucose-Capillary 179 (*)    All other components within normal limits     IMAGES: n/a   EKG 05/07/22:  Sinus tachycardia, rate 111 LAFB LVH   CV:  CT Calcium score 06/17/22:  FINDINGS: Non-cardiac: No significant non cardiac findings on limited lung and soft tissue windows. See separate report from [facility].   Ascending Aorta: Dilated with  diameter 4.2 cm   Pericardium: Normal   Coronary arteries: No calcium noted   IMPRESSION: Coronary calcium score of 0.  Dilated ascending thoracic aorta  Echo 06/05/22:  IMPRESSIONS     1. Left ventricular ejection fraction, by estimation, is 70 to 75%. The  left ventricle has hyperdynamic function. The left ventricle has no  regional wall motion abnormalities. There is mild left ventricular  hypertrophy. Left ventricular diastolic  parameters are consistent with Grade I diastolic dysfunction (impaired  relaxation).   2. Right ventricular systolic function is normal. The right ventricular  size is normal.   3. The mitral valve is normal in structure. No evidence of mitral valve  regurgitation. No evidence of mitral stenosis.   4. The aortic valve is tricuspid. Aortic valve regurgitation is not  visualized. No aortic stenosis is present.   5. Aortic dilatation noted. There is mild dilatation of the aortic root,  measuring 44 mm. There is mild dilatation of the ascending aorta,  measuring 43 mm.   6. The inferior vena cava is normal in size with greater than 50%  respiratory variability, suggesting right atrial pressure of 3 mmHg.   Comparison(s): No prior Echocardiogram.   Past Medical History:  Diagnosis Date   Arthritis    Diabetes mellitus    Diverticulitis    Elevated C-reactive protein (CRP)    Elevated PSA    Gastroesophagitis  GERD (gastroesophageal reflux disease)    History of kidney stones    History of vitamin D deficiency    Hypertension    Obese    Prostate cancer (HCC)    Renal disorder    Sleep apnea    on CPAP     Past Surgical History:  Procedure Laterality Date   CYSTOSCOPY WITH RETROGRADE PYELOGRAM, URETEROSCOPY AND STENT PLACEMENT Left 04/22/2018   Procedure: CYSTOSCOPY WITH RETROGRADE PYELOGRAM, URETEROSCOPY AND STENT PLACEMENT;  Surgeon: Sebastian Ache, MD;  Location: [facility]S;  Service: Urology;  Laterality: Left;  75 MINS   FINGER  SURGERY     right little finger   HOLMIUM LASER APPLICATION Left 04/22/2018   Procedure: HOLMIUM LASER APPLICATION;  Surgeon: Sebastian Ache, MD;  Location: [facility];  Service: Urology;  Laterality: Left;   KNEE ARTHROSCOPY     PROSTATE BIOPSY     ROTATOR CUFF REPAIR     TONSILLECTOMY AND ADENOIDECTOMY      MEDICATIONS:  atorvastatin (LIPITOR) 10 MG tablet   hydrochlorothiazide (HYDRODIURIL) 25 MG tablet   ibuprofen (ADVIL) 200 MG tablet   indapamide (LOZOL) 1.25 MG tablet   metFORMIN (GLUCOPHAGE) 850 MG tablet   methocarbamol (ROBAXIN) 500 MG tablet   NON FORMULARY   propranolol (INDERAL) 40 MG tablet   propranolol-hydrochlorothiazide (INDERIDE) 40-25 MG tablet   No current facility-administered medications for this encounter.    Marcille Blanco [facility] Short Stay/Anesthesiology [facility] Phone [phone] 09/16/2022 11:59 AM

## 2022-09-18 ENCOUNTER — Observation Stay (HOSPITAL_COMMUNITY)
Admission: RE | Admit: 2022-09-18 | Discharge: 2022-09-19 | Disposition: A | Discharge status: Home / Self Care | Payer: 59 | Attending: Urology | Admitting: Urology

## 2022-09-18 ENCOUNTER — Other Ambulatory Visit: Payer: Self-pay

## 2022-09-18 ENCOUNTER — Ambulatory Visit (HOSPITAL_BASED_OUTPATIENT_CLINIC_OR_DEPARTMENT_OTHER): Payer: 59 | Admitting: Certified Registered Nurse Anesthetist

## 2022-09-18 ENCOUNTER — Encounter (HOSPITAL_COMMUNITY)
Admission: RE | Disposition: A | Discharge status: Home / Self Care | Payer: Self-pay | Source: Home / Self Care | Attending: Urology

## 2022-09-18 ENCOUNTER — Encounter (HOSPITAL_COMMUNITY): Payer: Self-pay | Admitting: Urology

## 2022-09-18 ENCOUNTER — Ambulatory Visit (HOSPITAL_COMMUNITY): Payer: 59 | Admitting: Medical

## 2022-09-18 DIAGNOSIS — Z7984 Long term (current) use of oral hypoglycemic drugs: Secondary | ICD-10-CM

## 2022-09-18 DIAGNOSIS — G4730 Sleep apnea, unspecified: Secondary | ICD-10-CM | POA: Diagnosis not present

## 2022-09-18 DIAGNOSIS — C61 Malignant neoplasm of prostate: Secondary | ICD-10-CM

## 2022-09-18 DIAGNOSIS — N139 Obstructive and reflux uropathy, unspecified: Secondary | ICD-10-CM | POA: Diagnosis not present

## 2022-09-18 DIAGNOSIS — M1990 Unspecified osteoarthritis, unspecified site: Secondary | ICD-10-CM | POA: Diagnosis not present

## 2022-09-18 DIAGNOSIS — K219 Gastro-esophageal reflux disease without esophagitis: Secondary | ICD-10-CM | POA: Insufficient documentation

## 2022-09-18 DIAGNOSIS — R972 Elevated prostate specific antigen [PSA]: Secondary | ICD-10-CM | POA: Diagnosis not present

## 2022-09-18 DIAGNOSIS — N401 Enlarged prostate with lower urinary tract symptoms: Secondary | ICD-10-CM | POA: Diagnosis not present

## 2022-09-18 DIAGNOSIS — I10 Essential (primary) hypertension: Secondary | ICD-10-CM | POA: Diagnosis not present

## 2022-09-18 DIAGNOSIS — Z87442 Personal history of urinary calculi: Secondary | ICD-10-CM | POA: Insufficient documentation

## 2022-09-18 DIAGNOSIS — Z6841 Body Mass Index (BMI) 40.0 and over, adult: Secondary | ICD-10-CM | POA: Diagnosis not present

## 2022-09-18 DIAGNOSIS — E119 Type 2 diabetes mellitus without complications: Secondary | ICD-10-CM | POA: Diagnosis not present

## 2022-09-18 DIAGNOSIS — Z8042 Family history of malignant neoplasm of prostate: Secondary | ICD-10-CM | POA: Diagnosis not present

## 2022-09-18 HISTORY — PX: ROBOT ASSISTED LAPAROSCOPIC RADICAL PROSTATECTOMY: SHX5141

## 2022-09-18 HISTORY — PX: LYMPH NODE DISSECTION: SHX5087

## 2022-09-18 LAB — HEMOGLOBIN AND HEMATOCRIT, BLOOD
HCT: 43.3 % (ref 39.0–52.0)
Hemoglobin: 14.2 g/dL (ref 13.0–17.0)

## 2022-09-18 LAB — GLUCOSE, CAPILLARY
Glucose-Capillary: 262 mg/dL — ABNORMAL HIGH (ref 70–99)
Glucose-Capillary: 272 mg/dL — ABNORMAL HIGH (ref 70–99)
Glucose-Capillary: 299 mg/dL — ABNORMAL HIGH (ref 70–99)
Glucose-Capillary: 287 mg/dL — ABNORMAL HIGH (ref 70–99)

## 2022-09-18 SURGERY — PROSTATECTOMY, RADICAL, ROBOT-ASSISTED, LAPAROSCOPIC
Anesthesia: General | Site: Abdomen

## 2022-09-18 MED ORDER — DOCUSATE SODIUM 100 MG PO CAPS
100.0000 mg | ORAL_CAPSULE | Freq: Two times a day (BID) | ORAL | Status: DC
  Administered 2022-09-18 – 2022-09-19 (×2): 100 mg via ORAL
  Filled 2022-09-18 (×2): qty 1

## 2022-09-18 MED ORDER — HYOSCYAMINE SULFATE 0.125 MG SL SUBL: 0.1250 mg | SUBLINGUAL_TABLET | SUBLINGUAL | Status: DC | PRN

## 2022-09-18 MED ORDER — MIDAZOLAM HCL 2 MG/2ML IJ SOLN
INTRAMUSCULAR | Status: AC
  Filled 2022-09-18: qty 2

## 2022-09-18 MED ORDER — PROPOFOL 10 MG/ML IV BOLUS
INTRAVENOUS | Status: AC
  Filled 2022-09-18: qty 20

## 2022-09-18 MED ORDER — SUGAMMADEX SODIUM 200 MG/2ML IV SOLN
INTRAVENOUS | Status: DC | PRN
  Administered 2022-09-18: 400 mg via INTRAVENOUS

## 2022-09-18 MED ORDER — BUPIVACAINE LIPOSOME 1.3 % IJ SUSP
INTRAMUSCULAR | Status: AC
  Filled 2022-09-18: qty 20

## 2022-09-18 MED ORDER — ONDANSETRON HCL 4 MG/2ML IJ SOLN
INTRAMUSCULAR | Status: AC
  Filled 2022-09-18: qty 2

## 2022-09-18 MED ORDER — SODIUM CHLORIDE (PF) 0.9 % IJ SOLN
INTRAMUSCULAR | Status: DC | PRN
  Administered 2022-09-18: 20 mL

## 2022-09-18 MED ORDER — MIDAZOLAM HCL 5 MG/5ML IJ SOLN
INTRAMUSCULAR | Status: DC | PRN
  Administered 2022-09-18: 2 mg via INTRAVENOUS

## 2022-09-18 MED ORDER — LACTATED RINGERS IV SOLN: INTRAVENOUS | Status: DC

## 2022-09-18 MED ORDER — DIPHENHYDRAMINE HCL 12.5 MG/5ML PO ELIX: 12.5000 mg | ORAL_SOLUTION | Freq: Four times a day (QID) | ORAL | Status: DC | PRN

## 2022-09-18 MED ORDER — EPHEDRINE 5 MG/ML INJ
INTRAVENOUS | Status: AC
  Filled 2022-09-18: qty 5

## 2022-09-18 MED ORDER — HYDROMORPHONE HCL 2 MG/ML IJ SOLN
INTRAMUSCULAR | Status: AC
  Filled 2022-09-18: qty 1

## 2022-09-18 MED ORDER — FENTANYL CITRATE (PF) 250 MCG/5ML IJ SOLN
INTRAMUSCULAR | Status: AC
  Filled 2022-09-18: qty 5

## 2022-09-18 MED ORDER — ACETAMINOPHEN 500 MG PO TABS
1000.0000 mg | ORAL_TABLET | Freq: Four times a day (QID) | ORAL | Status: AC
  Administered 2022-09-18 – 2022-09-19 (×4): 1000 mg via ORAL
  Filled 2022-09-18 (×4): qty 2

## 2022-09-18 MED ORDER — HYDROMORPHONE HCL 1 MG/ML IJ SOLN
0.2500 mg | INTRAMUSCULAR | Status: DC | PRN
  Administered 2022-09-18 (×3): 0.5 mg via INTRAVENOUS

## 2022-09-18 MED ORDER — TRIPLE ANTIBIOTIC 3.5-400-5000 EX OINT: 1.0000 | TOPICAL_OINTMENT | Freq: Three times a day (TID) | CUTANEOUS | Status: DC | PRN

## 2022-09-18 MED ORDER — FENTANYL CITRATE (PF) 100 MCG/2ML IJ SOLN
INTRAMUSCULAR | Status: DC | PRN
  Administered 2022-09-18: 50 ug via INTRAVENOUS
  Administered 2022-09-18: 100 ug via INTRAVENOUS
  Administered 2022-09-18: 50 ug via INTRAVENOUS
  Administered 2022-09-18: 100 ug via INTRAVENOUS
  Administered 2022-09-18: 50 ug via INTRAVENOUS

## 2022-09-18 MED ORDER — ATORVASTATIN CALCIUM 10 MG PO TABS
10.0000 mg | ORAL_TABLET | Freq: Every day | ORAL | Status: DC
  Administered 2022-09-19: 10 mg via ORAL
  Filled 2022-09-18: qty 1

## 2022-09-18 MED ORDER — LIDOCAINE 2% (20 MG/ML) 5 ML SYRINGE
INTRAMUSCULAR | Status: DC | PRN
  Administered 2022-09-18: 50 mg via INTRAVENOUS

## 2022-09-18 MED ORDER — SULFAMETHOXAZOLE-TRIMETHOPRIM 800-160 MG PO TABS: 1.0000 | ORAL_TABLET | Freq: Two times a day (BID) | ORAL | 0 refills | Status: DC

## 2022-09-18 MED ORDER — ORAL CARE MOUTH RINSE: 15.0000 mL | OROMUCOSAL | Status: DC | PRN

## 2022-09-18 MED ORDER — DOCUSATE SODIUM 100 MG PO CAPS: 100.0000 mg | ORAL_CAPSULE | Freq: Two times a day (BID) | ORAL | Status: DC

## 2022-09-18 MED ORDER — SUCCINYLCHOLINE CHLORIDE 200 MG/10ML IV SOSY
PREFILLED_SYRINGE | INTRAVENOUS | Status: AC
  Filled 2022-09-18: qty 10

## 2022-09-18 MED ORDER — OXYCODONE HCL 5 MG PO TABS
5.0000 mg | ORAL_TABLET | ORAL | Status: DC | PRN
  Administered 2022-09-18 – 2022-09-19 (×4): 5 mg via ORAL
  Filled 2022-09-18 (×4): qty 1

## 2022-09-18 MED ORDER — HYDROMORPHONE HCL 1 MG/ML IJ SOLN
0.5000 mg | INTRAMUSCULAR | Status: DC | PRN
  Administered 2022-09-18 – 2022-09-19 (×3): 1 mg via INTRAVENOUS
  Filled 2022-09-18 (×3): qty 1

## 2022-09-18 MED ORDER — DEXAMETHASONE SODIUM PHOSPHATE 10 MG/ML IJ SOLN
INTRAMUSCULAR | Status: AC
  Filled 2022-09-18: qty 1

## 2022-09-18 MED ORDER — ONDANSETRON HCL 4 MG/2ML IJ SOLN
INTRAMUSCULAR | Status: DC | PRN
  Administered 2022-09-18: 4 mg via INTRAVENOUS

## 2022-09-18 MED ORDER — DEXAMETHASONE SODIUM PHOSPHATE 4 MG/ML IJ SOLN
INTRAMUSCULAR | Status: DC | PRN
  Administered 2022-09-18: 10 mg via INTRAVENOUS

## 2022-09-18 MED ORDER — PROPOFOL 10 MG/ML IV BOLUS
INTRAVENOUS | Status: DC | PRN
  Administered 2022-09-18: 200 mg via INTRAVENOUS

## 2022-09-18 MED ORDER — SODIUM CHLORIDE 0.45 % IV SOLN: INTRAVENOUS | Status: DC

## 2022-09-18 MED ORDER — ORAL CARE MOUTH RINSE: 15.0000 mL | Freq: Once | OROMUCOSAL | Status: AC

## 2022-09-18 MED ORDER — HYDROMORPHONE HCL 1 MG/ML IJ SOLN
INTRAMUSCULAR | Status: AC
  Administered 2022-09-18: 0.5 mg via INTRAVENOUS
  Filled 2022-09-18: qty 2

## 2022-09-18 MED ORDER — CEFAZOLIN IN SODIUM CHLORIDE 3-0.9 GM/100ML-% IV SOLN
3.0000 g | INTRAVENOUS | Status: AC
  Administered 2022-09-18: 3 g via INTRAVENOUS
  Filled 2022-09-18: qty 100

## 2022-09-18 MED ORDER — LACTATED RINGERS IR SOLN
Status: DC | PRN
  Administered 2022-09-18: 1000 mL

## 2022-09-18 MED ORDER — ROCURONIUM BROMIDE 10 MG/ML (PF) SYRINGE
PREFILLED_SYRINGE | INTRAVENOUS | Status: AC
  Filled 2022-09-18: qty 10

## 2022-09-18 MED ORDER — ROCURONIUM BROMIDE 10 MG/ML (PF) SYRINGE
PREFILLED_SYRINGE | INTRAVENOUS | Status: DC | PRN
  Administered 2022-09-18: 20 mg via INTRAVENOUS
  Administered 2022-09-18: 100 mg via INTRAVENOUS

## 2022-09-18 MED ORDER — INDAPAMIDE 1.25 MG PO TABS
1.2500 mg | ORAL_TABLET | Freq: Every day | ORAL | Status: DC
  Administered 2022-09-19: 1.25 mg via ORAL
  Filled 2022-09-18: qty 1

## 2022-09-18 MED ORDER — INSULIN ASPART 100 UNIT/ML IJ SOLN
4.0000 [IU] | Freq: Once | INTRAMUSCULAR | Status: AC
  Administered 2022-09-18: 4 [IU] via SUBCUTANEOUS

## 2022-09-18 MED ORDER — HYDROMORPHONE HCL 1 MG/ML IJ SOLN
INTRAMUSCULAR | Status: DC | PRN
Start: 2022-09-18 — End: 2022-09-18
  Administered 2022-09-18 (×2): 1 mg via INTRAVENOUS

## 2022-09-18 MED ORDER — SODIUM CHLORIDE (PF) 0.9 % IJ SOLN
INTRAMUSCULAR | Status: AC
  Filled 2022-09-18: qty 20

## 2022-09-18 MED ORDER — PROPRANOLOL HCL 40 MG PO TABS
40.0000 mg | ORAL_TABLET | Freq: Every day | ORAL | Status: DC
  Administered 2022-09-19: 40 mg via ORAL
  Filled 2022-09-18: qty 2

## 2022-09-18 MED ORDER — HYDROCHLOROTHIAZIDE 25 MG PO TABS
25.0000 mg | ORAL_TABLET | Freq: Every day | ORAL | Status: DC
  Administered 2022-09-19: 25 mg via ORAL
  Filled 2022-09-18: qty 1

## 2022-09-18 MED ORDER — INSULIN ASPART 100 UNIT/ML IJ SOLN
INTRAMUSCULAR | Status: AC
  Filled 2022-09-18: qty 1

## 2022-09-18 MED ORDER — LIDOCAINE HCL (PF) 2 % IJ SOLN
INTRAMUSCULAR | Status: AC
  Filled 2022-09-18: qty 5

## 2022-09-18 MED ORDER — ONDANSETRON HCL 4 MG/2ML IJ SOLN: 4.0000 mg | INTRAMUSCULAR | Status: DC | PRN

## 2022-09-18 MED ORDER — BUPIVACAINE LIPOSOME 1.3 % IJ SUSP
INTRAMUSCULAR | Status: DC | PRN
  Administered 2022-09-18: 20 mL

## 2022-09-18 MED ORDER — SODIUM CHLORIDE 0.9 % IV BOLUS
1000.0000 mL | Freq: Once | INTRAVENOUS | Status: AC
  Administered 2022-09-18: 1000 mL via INTRAVENOUS

## 2022-09-18 MED ORDER — SUCCINYLCHOLINE CHLORIDE 200 MG/10ML IV SOSY
PREFILLED_SYRINGE | INTRAVENOUS | Status: DC | PRN
  Administered 2022-09-18: 150 mg via INTRAVENOUS

## 2022-09-18 MED ORDER — ACETAMINOPHEN 500 MG PO TABS
1000.0000 mg | ORAL_TABLET | Freq: Once | ORAL | Status: AC
  Administered 2022-09-18: 1000 mg via ORAL
  Filled 2022-09-18: qty 2

## 2022-09-18 MED ORDER — CHLORHEXIDINE GLUCONATE 0.12 % MT SOLN
15.0000 mL | Freq: Once | OROMUCOSAL | Status: AC
  Administered 2022-09-18: 15 mL via OROMUCOSAL

## 2022-09-18 MED ORDER — INSULIN ASPART 100 UNIT/ML IJ SOLN
0.0000 [IU] | Freq: Three times a day (TID) | INTRAMUSCULAR | Status: DC
  Administered 2022-09-18: 8 [IU] via SUBCUTANEOUS
  Administered 2022-09-19 (×2): 5 [IU] via SUBCUTANEOUS

## 2022-09-18 MED ORDER — HYDROCODONE-ACETAMINOPHEN 5-325 MG PO TABS: 1.0000 | ORAL_TABLET | Freq: Four times a day (QID) | ORAL | 0 refills | Status: DC | PRN

## 2022-09-18 MED ORDER — DIPHENHYDRAMINE HCL 50 MG/ML IJ SOLN: 12.5000 mg | Freq: Four times a day (QID) | INTRAMUSCULAR | Status: DC | PRN

## 2022-09-18 MED ORDER — FENTANYL CITRATE (PF) 100 MCG/2ML IJ SOLN
INTRAMUSCULAR | Status: AC
  Filled 2022-09-18: qty 2

## 2022-09-18 SURGICAL SUPPLY — 72 items
ADH SKN CLS APL DERMABOND .7 (GAUZE/BANDAGES/DRESSINGS) ×2
APL PRP STRL LF DISP 70% ISPRP (MISCELLANEOUS) ×2
APL SWBSTK 6 STRL LF DISP (MISCELLANEOUS) ×2
APPLICATOR COTTON TIP 6 STRL (MISCELLANEOUS) ×3 IMPLANT
APPLICATOR COTTON TIP 6IN STRL (MISCELLANEOUS) ×2
BAG COUNTER SPONGE SURGICOUNT (BAG) IMPLANT
BAG SPNG CNTER NS LX DISP (BAG)
CATH FOLEY 2WAY SLVR 18FR 30CC (CATHETERS) ×3 IMPLANT
CATH TIEMANN FOLEY 18FR 5CC (CATHETERS) ×3 IMPLANT
CHLORAPREP W/TINT 26 (MISCELLANEOUS) ×3 IMPLANT
CLIP LIGATING HEM O LOK PURPLE (MISCELLANEOUS) ×6 IMPLANT
CNTNR URN SCR LID CUP LEK RST (MISCELLANEOUS) ×3 IMPLANT
CONT SPEC 4OZ STRL OR WHT (MISCELLANEOUS) ×2
COVER SURGICAL LIGHT HANDLE (MISCELLANEOUS) ×3 IMPLANT
COVER TIP SHEARS 8 DVNC (MISCELLANEOUS) ×3 IMPLANT
CUTTER ECHEON FLEX ENDO 45 340 (ENDOMECHANICALS) ×3 IMPLANT
DERMABOND ADVANCED .7 DNX12 (GAUZE/BANDAGES/DRESSINGS) ×3 IMPLANT
DRAPE ARM DVNC X/XI (DISPOSABLE) ×12 IMPLANT
DRAPE COLUMN DVNC XI (DISPOSABLE) ×3 IMPLANT
DRAPE SURG IRRIG POUCH 19X23 (DRAPES) ×3 IMPLANT
DRIVER NDL LRG 8 DVNC XI (INSTRUMENTS) ×6 IMPLANT
DRIVER NDLE LRG 8 DVNC XI (INSTRUMENTS) ×4 IMPLANT
DRSG TEGADERM 4X4.75 (GAUZE/BANDAGES/DRESSINGS) ×3 IMPLANT
ELECT PENCIL ROCKER SW 15FT (MISCELLANEOUS) ×3 IMPLANT
ELECT REM PT RETURN 15FT ADLT (MISCELLANEOUS) ×3 IMPLANT
FORCEPS BPLR LNG DVNC XI (INSTRUMENTS) ×3 IMPLANT
FORCEPS PROGRASP DVNC XI (FORCEP) ×3 IMPLANT
GAUZE 4X4 16PLY ~~LOC~~+RFID DBL (SPONGE) IMPLANT
GAUZE SPONGE 2X2 8PLY STRL LF (GAUZE/BANDAGES/DRESSINGS) IMPLANT
GAUZE SPONGE 4X4 12PLY STRL (GAUZE/BANDAGES/DRESSINGS) ×3 IMPLANT
GLOVE BIO SURGEON STRL SZ 6.5 (GLOVE) ×3 IMPLANT
GLOVE BIOGEL PI IND STRL 7.5 (GLOVE) ×3 IMPLANT
GLOVE SURG LX STRL 7.5 STRW (GLOVE) ×6 IMPLANT
GOWN STRL REUS W/ TWL XL LVL3 (GOWN DISPOSABLE) ×6 IMPLANT
GOWN STRL REUS W/TWL XL LVL3 (GOWN DISPOSABLE) ×4
GOWN STRL SURGICAL XL XLNG (GOWN DISPOSABLE) ×3 IMPLANT
HOLDER FOLEY CATH W/STRAP (MISCELLANEOUS) ×3 IMPLANT
IRRIG SUCT STRYKERFLOW 2 WTIP (MISCELLANEOUS) ×2
IRRIGATION SUCT STRKRFLW 2 WTP (MISCELLANEOUS) ×3 IMPLANT
IV LACTATED RINGERS 1000ML (IV SOLUTION) ×3 IMPLANT
KIT PROCEDURE DVNC SI (MISCELLANEOUS) ×3 IMPLANT
KIT TURNOVER KIT A (KITS) IMPLANT
NDL INSUFFLATION 14GA 120MM (NEEDLE) ×3 IMPLANT
NDL SPNL 22GX7 QUINCKE BK (NEEDLE) ×3 IMPLANT
NEEDLE INSUFFLATION 14GA 120MM (NEEDLE) ×2 IMPLANT
NEEDLE SPNL 22GX7 QUINCKE BK (NEEDLE) ×2 IMPLANT
PACK ROBOT UROLOGY CUSTOM (CUSTOM PROCEDURE TRAY) ×3 IMPLANT
PAD POSITIONING PINK XL (MISCELLANEOUS) ×3 IMPLANT
PLUG CATH AND CAP STRL 200 (CATHETERS) ×3 IMPLANT
PORT ACCESS TROCAR AIRSEAL 12 (TROCAR) ×3 IMPLANT
RELOAD STAPLE 45 4.1 GRN THCK (STAPLE) ×3 IMPLANT
SCISSORS MNPLR CVD DVNC XI (INSTRUMENTS) ×3 IMPLANT
SEAL UNIV 5-12 XI (MISCELLANEOUS) ×12 IMPLANT
SET CYSTO W/LG BORE CLAMP LF (SET/KITS/TRAYS/PACK) IMPLANT
SET TRI-LUMEN FLTR TB AIRSEAL (TUBING) ×3 IMPLANT
SOL ELECTROSURG ANTI STICK (MISCELLANEOUS) ×2
SOL PREP POV-IOD 4OZ 10% (MISCELLANEOUS) ×3 IMPLANT
SOLUTION ELECTROSURG ANTI STCK (MISCELLANEOUS) ×3 IMPLANT
SPIKE FLUID TRANSFER (MISCELLANEOUS) ×3 IMPLANT
SPONGE T-LAP 4X18 ~~LOC~~+RFID (SPONGE) ×3 IMPLANT
STAPLE RELOAD 45 GRN (STAPLE) ×2 IMPLANT
STAPLE RELOAD 45MM GREEN (STAPLE) ×2
SUT ETHILON 3 0 PS 1 (SUTURE) ×3 IMPLANT
SUT MNCRL AB 4-0 PS2 18 (SUTURE) ×6 IMPLANT
SUT PDS AB 1 CT1 27 (SUTURE) ×6 IMPLANT
SUT VIC AB 2-0 SH 27 (SUTURE) ×4
SUT VIC AB 2-0 SH 27X BRD (SUTURE) ×3 IMPLANT
SUT VICRYL 0 UR6 27IN ABS (SUTURE) ×3 IMPLANT
SUT VLOC BARB 180 ABS3/0GR12 (SUTURE) ×6
SUTURE VLOC BRB 180 ABS3/0GR12 (SUTURE) ×9 IMPLANT
SYR 27GX1/2 1ML LL SAFETY (SYRINGE) ×3 IMPLANT
WATER STERILE IRR 1000ML POUR (IV SOLUTION) ×3 IMPLANT

## 2022-09-18 NOTE — Op Note (Signed)
NAME: Edgar Kidd, Edgar Kidd MEDICAL RECORD NO: [id number] ACCOUNT NO: [id number] DATE OF BIRTH: [birth year] FACILITY: [facility] LOCATION: [facility] PHYSICIAN: Sebastian Ache, MD  Operative Report   DATE OF PROCEDURE: 09/18/2022  PREOPERATIVE DIAGNOSIS:  Large volume low risk prostate cancer, obstructive voiding symptoms.  PROCEDURES:   1.  Robotic-assisted laparoscopic radical prostatectomy. 2.  Bilateral pelvic lymphadenectomy.  ESTIMATED BLOOD LOSS:  300 mL.  COMPLICATIONS:  None.  SPECIMEN:   1.  Right external iliac lymph nodes. 2. Right obturator lymph nodes. 3.  Left external iliac lymph nodes. 4.  Left obturator lymph nodes. 5.  Prostatectomy.  ASSISTANT:  Harrie Foreman, PA  DRAINS: 1.  Jackson-Pratt drain to bulb suction. 2.  Foley catheter to straight drain.  FINDINGS: 1.  Large stature and habitus as anticipated. 2.  No prostate, median lobe.  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  He also has a history of obstructive voiding symptoms  that were progressive. His prostate volume was approximately 60 grams.  His disease is not overly concerning in terms of grade, but the volume being some coursing over 50% of the tumor. This is concerning especially at his younger age.  He also has a  family history of prostate cancer.  Options were discussed for management including surveillance protocols versus ablative therapy versus surgical extirpation and he adamantly wished to proceed with surgical extirpation with curative intent.  He  understands that his habitus and metabolic comorbidity do increase risk of complications; however, he does have an amazingly good functional status and is felt to be a reasonable candidate.  Informed consent was obtained and placed in medical record.  PROCEDURE DETAILS:  The patient being  identified and verified. Procedure being radical prostatectomy with node dissection was confirmed.  Procedure timeout was performed.  Intravenous antibiotics were administered.  General endotracheal anesthesia was  carefully induced.  The patient was placed into a low lithotomy position.  Sterile field was created, prepped and draped the patient's penis, perineum, and proximal thighs using iodine and his infra-xiphoid abdomen using chlorhexidine gluconate after  clipper shaving and after further fastening him to the operating table using 3-inch tape with foam padding.  His arms were tucked to the side using gel rolls.  Test of steep Trendelenburg positioning was performed and found to be suitably positioned.   Foley catheter was placed per urethra to straight drain.  Next, a high flow, low pressure, pneumoperitoneum was obtained using Veress technique in the supraumbilical midline incision purposely approximately 8 cm superior to the umbilicus to allow more  favorable angulation to the pelvic brim. An 8 mm robotic camera port was then placed in the location.  Laparoscopic examination of peritoneal cavity revealed no significant adhesions, no visceral injury.  Additional ports were placed as follows:  Right  paramedian 8 mm robotic port, right far lateral 12 mm AirSeal assist port, right paramedian 5 mm suction port, left paramedian 8 mm robotic port, left far lateral 8 mm robotic port.  Robot was docked and passed the electronic checks.  Initial attention  was directed at development of space of Retzius.  Incision was made lateral to the right median umbilical ligament from the midline towards the area of the internal ring coursing along the iliac vessels towards the area of the right ureter. The right vas  deferens was encountered, ligated using medial bucket  handle and the right bladder was swept away from pelvic sidewall towards the area of the endopelvic fascia on the right side.  A mirror image  dissection was performed on the left side.  Anterior  attachments were taken down with cautery scissors to expose the anterior base of the prostate, which was defatted to better denote the bladder neck prostate junction.  The endopelvic fascia was swept away from the prostate in a base to apex orientation  to expose the dorsal venous complex, which was controlled using green load stapler resulting in excellent hemostatic control. Attention was then directed at lymphadenectomy, first on the right side.  All fibrofatty tissue within the boundaries of the  right external iliac artery, vein, pelvic sidewall were dissected free.  Lymphostasis was achieved with cold clips set aside labeled as right external lymph nodes.  Next, right obturator group was dissected free with the boundaries being right external  iliac vein, pelvic sidewall, obturator nerve.  Lymphostasis was achieved with cold clips set aside labeled as right obturator lymph nodes.  The right obturator nerve was inspected following maneuvers and found to be uninjured.  A mirror image  lymphadenectomy was performed of the left side, left external iliac and left obturator group respectively and the left obturator nerve was inspected and found to be uninjured.  Attention was directed at bladder dissection.  A lateral release was  performed on each side to better denote the bladder neck, true caliber and the bladder neck was separated from the prostate in base to apex orientation keeping what appeared to be a rim of circular muscle fibers each plane of dissection.  I was quite  happy with essentially complete bladder neck preservation.  Posterior dissection was performed by incising approximately 8 mm inferior posterior to the posterior lip of the prostate entering the plane of Denonvilliers.  Bilateral seminal vessels were  encountered as were the vas deferens.  Vas were dissected for approximately 4 cm, ligated and placed on gentle superior traction.   Bilateral seminal vesicles were dissected to their tip and placed on gentle superior traction.  Dissection continued in the  posterior plane towards the area of the apex of the prostate as denoted by anterior curvature.  This exposed the vascular pedicles, which were carefully controlled using a sequential clipping technique in a base to apex orientation. His habitus did make  retraction of the pedicle challenging, but very acceptable and I was quite happy with the hemostasis.  Final apical dissection was performed anterior plane placing the prostate in superior traction transecting the membranous urethra coldly.  There was  some desmoplasia around the area of the apex, but no obvious extracapsular disease.  This completely freed up the prostate specimen, which was placed in EndoCatch bag for later retrieval.  Next, digital rectal exam was performed using indicator glove  under laparoscopic vision.  No evidence of rectal violation was noted.  Posterior reconstruction was performed using 3-0 V-Loc suture, reapproximating the posterior urethral plate, the posterior bladder neck, bringing the structures into tension-free  apposition.  Next mucosa-to-mucosa anastomosis was performed using double armed 3-0 V-Loc suture from the 6 o'clock to 12 o'clock position, which revealed an excellent tension-free apposition.  A new Foley catheter was then placed, which irrigated  quantitatively.  Sponge and needle counts were correct.  Hemostasis was excellent.  We achieved the goals of extirpative portion of procedure today.  Robot was then undocked.  The previous right lateral most assistant port site was closed at  the level of  fascia using Carter-Thomason suture passer and 0 Vicryl.  Specimen was retrieved and placed in the camera port site inferiorly for a total distance of approximately 4 inches removing the prostatectomy specimen setting aside for permanent pathology.   Extraction site was closed at the level of  fascia using figure-of-eight PDS x4 followed by reapproximation of Scarpa's with running Vicryl.  All incision sites were infiltrated with dilute lipolyzed Marcaine and closed at the level of the skin using  subcuticular Monocryl followed by Dermabond.  Procedure was then terminated.  The patient tolerated the procedure well, no immediate perioperative complications.  The patient was taken to postanesthesia care in stable condition.  Plan for observation  admission.  Please note, first assistant, Harrie Foreman, was crucial for all portions of the surgery today.  She provided invaluable retraction, suctioning, robotic instrument exchange, vascular stapling, lymphatic clipping and general first assistance.   PUS D: 09/18/2022 12:08:28 pm T: 09/18/2022 12:39:00 pm  JOB: [id number]/ [id number]

## 2022-09-18 NOTE — Brief Op Note (Signed)
09/18/2022  11:59 AM  PATIENT:  Lenord Fellers Jarmon  52 y.o. male  PRE-OPERATIVE DIAGNOSIS:  PROSTATE CANCER  POST-OPERATIVE DIAGNOSIS:  PROSTATE CANCER  PROCEDURE:  Procedure(s) with comments: XI ROBOTIC ASSISTED LAPAROSCOPIC RADICAL PROSTATECTOMY (N/A) - 3.5 HRS PELVIC LYMPH NODE DISSECTION (Bilateral)  SURGEON:  Surgeons and Role:    * , Delbert Phenix., MD - Primary  PHYSICIAN ASSISTANT:   ASSISTANTS: Harrie Foreman PA   ANESTHESIA:   local and general  EBL:  300 mL   BLOOD ADMINISTERED:none  DRAINS:  Foley to gravity; JP to bulb    LOCAL MEDICATIONS USED:  MARCAINE     SPECIMEN:  Source of Specimen:  prostatectomy; pelvic lymph nodes  DISPOSITION OF SPECIMEN:  PATHOLOGY  COUNTS:  YES  TOURNIQUET:  * No tourniquets in log *  DICTATION: .Other Dictation: Dictation Number [id number]  PLAN OF CARE: Admit for overnight observation  PATIENT DISPOSITION:  PACU - hemodynamically stable.   Delay start of Pharmacological VTE agent (>24hrs) due to surgical blood loss or risk of bleeding: yes

## 2022-09-18 NOTE — Anesthesia Procedure Notes (Signed)
Procedure Name: Intubation Date/Time: 09/18/2022 8:46 AM  Performed by: Vanessa , CRNAPre-anesthesia Checklist: Patient identified, Emergency Drugs available, Suction available and Patient being monitored Patient Re-evaluated:Patient Re-evaluated prior to induction Oxygen Delivery Method: Circle system utilized Preoxygenation: Pre-oxygenation with 100% oxygen Induction Type: IV induction Ventilation: Mask ventilation without difficulty Laryngoscope Size: 2 and Miller Grade View: Grade I Tube type: Oral Tube size: 8.0 mm Number of attempts: 1 Airway Equipment and Method: Stylet Placement Confirmation: ETT inserted through vocal cords under direct vision, positive ETCO2 and breath sounds checked- equal and bilateral Secured at: 22 cm Tube secured with: Tape Dental Injury: Teeth and Oropharynx as per pre-operative assessment

## 2022-09-18 NOTE — Discharge Instructions (Signed)

## 2022-09-18 NOTE — Transfer of Care (Signed)
Immediate Anesthesia Transfer of Care Note  Patient: Edgar Kidd  Procedure(s) Performed: XI ROBOTIC ASSISTED LAPAROSCOPIC RADICAL PROSTATECTOMY (Abdomen) PELVIC LYMPH NODE DISSECTION (Bilateral: Abdomen)  Patient Location: PACU  Anesthesia Type:General  Level of Consciousness: awake and patient cooperative  Airway & Oxygen Therapy: Patient Spontanous Breathing and Patient connected to face mask  Post-op Assessment: Report given to RN and Post -op Vital signs reviewed and stable  Post vital signs: Reviewed and stable  Last Vitals:  Vitals Value Taken Time  BP    Temp    Pulse 81 09/18/22 1210  Resp 20 09/18/22 1210  SpO2 95 % 09/18/22 1210  Vitals shown include unfiled device data.  Last Pain:  Vitals:   09/18/22 0651  TempSrc: Oral         Complications: No notable events documented.

## 2022-09-18 NOTE — Progress Notes (Signed)
   09/18/22 2246  BiPAP/CPAP/SIPAP  BiPAP/CPAP/SIPAP Pt Type Adult  BiPAP/CPAP/SIPAP DREAMSTATIOND  Mask Type Nasal mask  Mask Size Medium  FiO2 (%) 21 %  Patient Home Equipment No  Auto Titrate Yes (7-20)

## 2022-09-18 NOTE — Anesthesia Postprocedure Evaluation (Signed)
Anesthesia Post Note  Patient: Edgar Kidd  Procedure(s) Performed: XI ROBOTIC ASSISTED LAPAROSCOPIC RADICAL PROSTATECTOMY (Abdomen) PELVIC LYMPH NODE DISSECTION (Bilateral: Abdomen)     Patient location during evaluation: PACU Anesthesia Type: General Level of consciousness: awake and alert Pain management: pain level controlled Vital Signs Assessment: post-procedure vital signs reviewed and stable Respiratory status: spontaneous breathing, nonlabored ventilation, respiratory function stable and patient connected to nasal cannula oxygen Cardiovascular status: blood pressure returned to baseline and stable Postop Assessment: no apparent nausea or vomiting Anesthetic complications: no  No notable events documented.  Last Vitals:  Vitals:   09/18/22 1300 09/18/22 1315  BP: 122/74 113/78  Pulse: 72 69  Resp: 19 20  Temp:    SpO2: 98% 98%    Last Pain:  Vitals:   09/18/22 1315  TempSrc:   PainSc: 7                  ,W. EDMOND

## 2022-09-18 NOTE — H&P (Signed)
Edgar Kidd is an 52 y.o. male.    Chief Complaint: Pre-Op Prostatectomy  HPI:   1 - Low Risk Prostate Cancer - Pts' father with prostate cancer treated with surgyery brother on surveillance. 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 TRUS BX 62 mL, some intravesical protursion and significant dystorphic calcifications. No overt median.   2-  Lower Urinary Tract Symptoms / Urinary Urgency / Enlarged Prostate - progressive bother from irritative nad obstructive symptoms x years. Non-diabetic, UA bland.Marland Kitchen PVR 04/2018 " 13 mL" (normal). No benefit from 3 mos finasteride 07/2018. Starting daily tadalafil 07/2018 for dual indications.    PMH sit for morbid obesity, DM2 (A1c 8-10s), knee arthroscopy, TNA. No ischemic CV disease / blood thinners. He drives 18 wheeler, mostly day trips. His PCP is Kyla Balzarine MD with [facility].   Today " Edgar Kidd " is seen to proceed with prostatectomy for low risk cancer with progressive lower urinary tract symptoms. Cr 1, Hgb 15, A1c up some at 10 but CBG's <180.   Past Medical History:  Diagnosis Date   Arthritis    Diabetes mellitus    Diverticulitis    Elevated C-reactive protein (CRP)    Elevated PSA    Gastroesophagitis    GERD (gastroesophageal reflux disease)    History of kidney stones    History of vitamin D deficiency    Hypertension    Obese    Prostate cancer (HCC)    Renal disorder    Sleep apnea    on CPAP     Past Surgical History:  Procedure Laterality Date   CYSTOSCOPY WITH RETROGRADE PYELOGRAM, URETEROSCOPY AND STENT PLACEMENT Left 04/22/2018   Procedure: CYSTOSCOPY WITH RETROGRADE PYELOGRAM, URETEROSCOPY AND STENT PLACEMENT;  Surgeon: Sebastian Ache, MD;  Location: [facility];  Service: Urology;  Laterality: Left;  75 MINS   FINGER SURGERY     right little finger   HOLMIUM LASER APPLICATION Left 04/22/2018   Procedure: HOLMIUM LASER APPLICATION;   Surgeon: Sebastian Ache, MD;  Location: [facility];  Service: Urology;  Laterality: Left;   KNEE ARTHROSCOPY     PROSTATE BIOPSY     ROTATOR CUFF REPAIR     TONSILLECTOMY AND ADENOIDECTOMY      No family history on file. Social History:  reports that he has never smoked. He has never used smokeless tobacco. He reports that he does not drink alcohol and does not use drugs.  Allergies:  Allergies  Allergen Reactions   Shellfish Allergy Anaphylaxis    No medications prior to admission.    No results found for this or any previous visit (from the past 48 hour(s)). No results found.  Review of Systems  Constitutional:  Negative for chills and fever.  All other systems reviewed and are negative.   There were no vitals taken for this visit. Physical Exam Vitals reviewed.  Constitutional:      Comments: Pleasant, at baseline.   HENT:     Nose: Nose normal.  Eyes:     Pupils: Pupils are equal, round, and reactive to light.  Cardiovascular:     Rate and Rhythm: Normal rate.  Abdominal:     Comments: Stable very large truncal obesity  Genitourinary:    Comments: No CVAT at present Musculoskeletal:        General: Normal range of motion.     Cervical back: Normal range of  motion.  Neurological:     General: No focal deficit present.     Mental Status: He is alert.  Psychiatric:        Mood and Affect: Mood normal.      Assessment/Plan  Proceed as planned with prostatectomy / node dissection. Risks, benefits, alternatives, expected peri--op course discussed previously and reiterated today. He understands that his size and metabolic comorbidity increases risk of ALL peri-op complications including infection, MI, Mortality.   Loletta Parish., MD 09/18/2022, 6:17 AM

## 2022-09-19 ENCOUNTER — Encounter (HOSPITAL_COMMUNITY): Payer: Self-pay | Admitting: Urology

## 2022-09-19 DIAGNOSIS — C61 Malignant neoplasm of prostate: Secondary | ICD-10-CM | POA: Diagnosis not present

## 2022-09-19 LAB — GLUCOSE, CAPILLARY
Glucose-Capillary: 233 mg/dL — ABNORMAL HIGH (ref 70–99)
Glucose-Capillary: 211 mg/dL — ABNORMAL HIGH (ref 70–99)

## 2022-09-19 MED ORDER — CHLORHEXIDINE GLUCONATE CLOTH 2 % EX PADS: 6.0000 | MEDICATED_PAD | Freq: Every day | CUTANEOUS | Status: DC

## 2022-09-19 NOTE — Plan of Care (Signed)
  Problem: Education: Goal: Ability to describe self-care measures that may prevent or decrease complications (Diabetes Survival Skills Education) will improve Outcome: Completed/Met Goal: Individualized Educational Video(s) Outcome: Completed/Met   Problem: Coping: Goal: Ability to adjust to condition or change in health will improve Outcome: Completed/Met   Problem: Fluid Volume: Goal: Ability to maintain a balanced intake and output will improve Outcome: Completed/Met   Problem: Health Behavior/Discharge Planning: Goal: Ability to identify and utilize available resources and services will improve Outcome: Completed/Met Goal: Ability to manage health-related needs will improve Outcome: Completed/Met   Problem: Metabolic: Goal: Ability to maintain appropriate glucose levels will improve Outcome: Completed/Met   Problem: Nutritional: Goal: Maintenance of adequate nutrition will improve Outcome: Completed/Met Goal: Progress toward achieving an optimal weight will improve Outcome: Completed/Met   Problem: Skin Integrity: Goal: Risk for impaired skin integrity will decrease Outcome: Completed/Met   Problem: Tissue Perfusion: Goal: Adequacy of tissue perfusion will improve Outcome: Completed/Met   Problem: Education: Goal: Knowledge of the procedure and recovery process will improve Outcome: Completed/Met   Problem: Bowel/Gastric: Goal: Gastrointestinal status for postoperative course will improve Outcome: Completed/Met   Problem: Pain Management: Goal: General experience of comfort will improve Outcome: Completed/Met   Problem: Skin Integrity: Goal: Demonstration of wound healing without infection will improve Outcome: Completed/Met   Problem: Urinary Elimination: Goal: Ability to avoid or minimize complications of infection will improve Outcome: Completed/Met Goal: Ability to achieve and maintain urine output will improve Outcome: Completed/Met Goal: Home care  management will improve Outcome: Completed/Met   Problem: Education: Goal: Knowledge of General Education information will improve Description: Including pain rating scale, medication(s)/side effects and non-pharmacologic comfort measures Outcome: Completed/Met   Problem: Health Behavior/Discharge Planning: Goal: Ability to manage health-related needs will improve Outcome: Completed/Met   Problem: Clinical Measurements: Goal: Ability to maintain clinical measurements within normal limits will improve Outcome: Completed/Met Goal: Will remain free from infection Outcome: Completed/Met Goal: Diagnostic test results will improve Outcome: Completed/Met Goal: Respiratory complications will improve Outcome: Completed/Met Goal: Cardiovascular complication will be avoided Outcome: Completed/Met   Problem: Activity: Goal: Risk for activity intolerance will decrease Outcome: Completed/Met   Problem: Nutrition: Goal: Adequate nutrition will be maintained Outcome: Completed/Met   Problem: Coping: Goal: Level of anxiety will decrease Outcome: Completed/Met   Problem: Elimination: Goal: Will not experience complications related to bowel motility Outcome: Completed/Met Goal: Will not experience complications related to urinary retention Outcome: Completed/Met   Problem: Pain Managment: Goal: General experience of comfort will improve Outcome: Completed/Met   Problem: Safety: Goal: Ability to remain free from injury will improve Outcome: Completed/Met   Problem: Skin Integrity: Goal: Risk for impaired skin integrity will decrease Outcome: Completed/Met

## 2022-09-19 NOTE — TOC CM/SW Note (Signed)
Transition of Care [facility]) - Inpatient Brief Assessment   Patient Details  Name: Edgar Kidd MRN: [id number] Date of Birth: 03/09/1970  [facility]) CM/SW Contact:    Howell Rucks, RN Phone Number: 09/19/2022, 10:36 AM   Clinical Narrative: Met with pt and pt's spouse at  bedside to introduce role of TOC/NCM and review for dc planning. Pt confirmed he has PCP and pharmacy in place, no home care services, home DME- CPAP, pt reports he feels safe returning home with support from his spouse, confirmed he has transportation available at discharge. TOC Brief Assessment completed. No TOC needs identified.     Transition of Care Asessment: Insurance and Status: Insurance coverage has been reviewed Patient has primary care physician: Yes Home environment has been reviewed: private residence with spouse Prior level of function:: Independent Prior/Current Home Services: No current home services Social Determinants of Health Reivew: SDOH reviewed no interventions necessary Readmission risk has been reviewed: Yes Transition of care needs: no transition of care needs at this time

## 2022-09-19 NOTE — Progress Notes (Signed)
Orders were placed for patient to be discharged home. Patient was given discharge paperwork/instructions. RN went over discharge paperwork/instructions with the patient and family. RN also went over with patient and patient's family how to change from standard foley bag to leg bag and foley care. Patient and patient's family did not have any question or concerns. RN supplied patient with some gauze and Tegaderm dressing for incision site where JP drain had been, a standard Foley bag, a couple of leg bags, and a urinal to measure urinary output. Patient left the hospital stable with foley catheter still in place, had discharge paperwork/instructions, had supplies, and had all personal belongings.

## 2022-09-19 NOTE — Plan of Care (Signed)
  Problem: Coping: Goal: Ability to adjust to condition or change in health will improve Outcome: Progressing   Problem: Fluid Volume: Goal: Ability to maintain a balanced intake and output will improve Outcome: Progressing   Problem: Skin Integrity: Goal: Risk for impaired skin integrity will decrease Outcome: Progressing   Problem: Tissue Perfusion: Goal: Adequacy of tissue perfusion will improve Outcome: Progressing

## 2022-09-19 NOTE — Discharge Summary (Signed)
Date of admission: 09/18/2022  Date of discharge: 09/19/2022  Admission diagnosis: Prostate cancer  Discharge diagnosis: Prostate cancer   Secondary diagnoses:  Patient Active Problem List   Diagnosis Date Noted   Prostate cancer (HCC) 09/18/2022    Procedures performed: Procedure(s): XI ROBOTIC ASSISTED LAPAROSCOPIC RADICAL PROSTATECTOMY PELVIC LYMPH NODE DISSECTION  History and Physical: For full details, please see admission history and physical. Briefly, Edgar Kidd is a 52 y.o. year old patient with large volume low risk prostate cancer who underwent robotic assisted laparoscopic radical prostatectomy.   Hospital Course: Patient tolerated the procedure well.  He was then transferred to the floor after an uneventful PACU stay.  His hospital course was uncomplicated.  On POD#1 he had met discharge criteria: was eating a regular diet, was up and ambulating independently,  pain was well controlled, was voiding with a catheter, and was ready to for discharge.   Laboratory values:  Recent Labs    09/18/22 1230 09/19/22 0409  HGB 14.2 13.3  HCT 43.3 39.8   Recent Labs    09/19/22 0409  NA 133*  K 3.9  CL 98  CO2 25  GLUCOSE 260*  BUN 19  CREATININE 1.20  CALCIUM 8.6*   No results for input(s): "LABPT", "INR" in the last 72 hours. No results for input(s): "LABURIN" in the last 72 hours. Results for orders placed or performed during the hospital encounter of 09/10/12  Urine culture     Status: None   Collection Time: 09/10/12  4:00 PM  Result Value Ref Range Status   Specimen Description URINE, CLEAN CATCH  Final   Special Requests NONE  Final   Culture  Setup Time 09/11/2012 01:07  Final   Colony Count NO GROWTH  Final   Culture NO GROWTH  Final   Report Status 09/12/2012 FINAL  Final    Disposition: Home  Discharge instruction: The patient was instructed to be ambulatory but told to refrain from heavy lifting, strenuous activity, or driving.   Discharge  medications:  Allergies as of 09/19/2022       Reactions   Shellfish Allergy Anaphylaxis        Medication List     STOP taking these medications    ibuprofen 200 MG tablet Commonly known as: ADVIL       TAKE these medications    atorvastatin 10 MG tablet Commonly known as: Lipitor Take 1 tablet (10 mg total) by mouth daily.   docusate sodium 100 MG capsule Commonly known as: COLACE Take 1 capsule (100 mg total) by mouth 2 (two) times daily.   hydrochlorothiazide 25 MG tablet Commonly known as: HYDRODIURIL Take 25 mg by mouth daily.   HYDROcodone-acetaminophen 5-325 MG tablet Commonly known as: Norco Take 1-2 tablets by mouth every 6 (six) hours as needed for moderate pain or severe pain.   indapamide 1.25 MG tablet Commonly known as: LOZOL Take 1.25 mg by mouth daily.   metFORMIN 850 MG tablet Commonly known as: GLUCOPHAGE Take 850 mg by mouth 2 (two) times daily.   NON FORMULARY Pt uses a c-pap nightly   propranolol 40 MG tablet Commonly known as: INDERAL Take 40 mg by mouth daily.   sulfamethoxazole-trimethoprim 800-160 MG tablet Commonly known as: BACTRIM DS Take 1 tablet by mouth 2 (two) times daily. Start the day prior to foley removal appointment        Followup:   Follow-up Information     Manny, Delbert Phenix., MD Follow up on  09/30/2022.   Specialty: Urology Why: at 9:30 for MD visit, catheter removal, and pathology review Contact information: [address] [place] [address] [phone]

## 2022-09-19 NOTE — Progress Notes (Addendum)
   1 Day Post-Op Subjective: POD1 from RALP. Doing well. VSS. Hg 13 from 14. Cr mild uptrend to 1.2 from 1.   UOP appropriate. Drain output low.   Pt doing well this morning. Walking the halls.   Objective: Vital signs in last 24 hours: Temp:  [97.1 F (36.2 C)-98.3 F (36.8 C)] 98.3 F (36.8 C) (08/08 0426) Pulse Rate:  [64-100] 92 (08/08 0426) Resp:  [14-24] 20 (08/08 0426) BP: (106-136)/(71-87) 110/83 (08/08 0426) SpO2:  [91 %-98 %] 94 % (08/08 0426) FiO2 (%):  [21 %] 21 % (08/07 2246)  Assessment/Plan:  Intake/Output from previous day: 08/07 0701 - 08/08 0700 In: 3859 [P.O.:480; I.V.:2279; IV Piggyback:1100] Out: 3020 [Urine:2580; Drains:140; Blood:300]  Intake/Output this shift: No intake/output data recorded.  Physical Exam:  General: Alert and oriented CV: No cyanosis Lungs: equal chest rise Abdomen: Soft, appropriately tender, Incision, clean dry and intact.  Gu: Catheter in place with clear yellow urine.   Lab Results: Recent Labs    09/18/22 1230 09/19/22 0409  HGB 14.2 13.3  HCT 43.3 39.8   BMET Recent Labs    09/19/22 0409  NA 133*  K 3.9  CL 98  CO2 25  GLUCOSE 260*  BUN 19  CREATININE 1.20  CALCIUM 8.6*     Studies/Results: No results found. Plan:  Drain removal OOB DC IV meds Likely progress to discharge later today.    LOS: 0 days   Jerald Kief, MD, PhD [facility] Resident  [facility]   Plan discussed with Dr. Berneice Heinrich   09/19/2022, 7:11 AM

## 2022-10-18 ENCOUNTER — Ambulatory Visit: Payer: 59 | Attending: Cardiovascular Disease

## 2022-10-18 DIAGNOSIS — I77819 Aortic ectasia, unspecified site: Secondary | ICD-10-CM

## 2022-10-18 LAB — BASIC METABOLIC PANEL
BUN/Creatinine Ratio: 15 (ref 9–20)
BUN: 17 mg/dL (ref 6–24)
CO2: 27 mmol/L (ref 20–29)
Calcium: 9.6 mg/dL (ref 8.7–10.2)
Chloride: 98 mmol/L (ref 96–106)
Creatinine, Ser: 1.14 mg/dL (ref 0.76–1.27)
Glucose: 176 mg/dL — ABNORMAL HIGH (ref 70–99)
Potassium: 3.6 mmol/L (ref 3.5–5.2)
Sodium: 139 mmol/L (ref 134–144)
eGFR: 76 mL/min/{1.73_m2} (ref >59)

## 2022-11-14 ENCOUNTER — Ambulatory Visit (HOSPITAL_COMMUNITY)
Admission: RE | Admit: 2022-11-14 | Discharge: 2022-11-14 | Disposition: A | Discharge status: Home / Self Care | Payer: 59 | Source: Ambulatory Visit | Attending: Cardiovascular Disease | Admitting: Cardiovascular Disease

## 2022-11-14 DIAGNOSIS — I77819 Aortic ectasia, unspecified site: Secondary | ICD-10-CM | POA: Diagnosis present

## 2022-11-14 MED ORDER — IOHEXOL 350 MG/ML SOLN
75.0000 mL | Freq: Once | INTRAVENOUS | Status: AC | PRN
  Administered 2022-11-14: 75 mL via INTRAVENOUS

## 2022-12-29 ENCOUNTER — Encounter (HOSPITAL_BASED_OUTPATIENT_CLINIC_OR_DEPARTMENT_OTHER): Payer: Self-pay

## 2022-12-29 ENCOUNTER — Emergency Department (HOSPITAL_BASED_OUTPATIENT_CLINIC_OR_DEPARTMENT_OTHER)
Admission: EM | Admit: 2022-12-29 | Discharge: 2022-12-30 | Disposition: A | Discharge status: Home / Self Care | Payer: 59 | Attending: Emergency Medicine | Admitting: Emergency Medicine

## 2022-12-29 DIAGNOSIS — L299 Pruritus, unspecified: Secondary | ICD-10-CM | POA: Diagnosis not present

## 2022-12-29 DIAGNOSIS — L509 Urticaria, unspecified: Secondary | ICD-10-CM | POA: Diagnosis not present

## 2022-12-29 DIAGNOSIS — E119 Type 2 diabetes mellitus without complications: Secondary | ICD-10-CM | POA: Diagnosis not present

## 2022-12-29 DIAGNOSIS — R21 Rash and other nonspecific skin eruption: Secondary | ICD-10-CM | POA: Diagnosis present

## 2022-12-29 DIAGNOSIS — Z79899 Other long term (current) drug therapy: Secondary | ICD-10-CM | POA: Insufficient documentation

## 2022-12-29 DIAGNOSIS — I10 Essential (primary) hypertension: Secondary | ICD-10-CM | POA: Diagnosis not present

## 2022-12-29 DIAGNOSIS — Z7984 Long term (current) use of oral hypoglycemic drugs: Secondary | ICD-10-CM | POA: Insufficient documentation

## 2022-12-29 LAB — CBC WITH DIFFERENTIAL/PLATELET
Abs Immature Granulocytes: 0.02 10*3/uL (ref 0.00–0.07)
Basophils Absolute: 0 10*3/uL (ref 0.0–0.1)
Basophils Relative: 0 %
Eosinophils Absolute: 0.2 10*3/uL (ref 0.0–0.5)
Eosinophils Relative: 2 %
HCT: 43.8 % (ref 39.0–52.0)
Hemoglobin: 14.7 g/dL (ref 13.0–17.0)
Immature Granulocytes: 0 %
Lymphocytes Relative: 26 %
Lymphs Abs: 2.6 10*3/uL (ref 0.7–4.0)
MCH: 29.2 pg (ref 26.0–34.0)
MCHC: 33.6 g/dL (ref 30.0–36.0)
MCV: 87.1 fL (ref 80.0–100.0)
Monocytes Absolute: 0.6 10*3/uL (ref 0.1–1.0)
Monocytes Relative: 6 %
Neutro Abs: 6.4 10*3/uL (ref 1.7–7.7)
Neutrophils Relative %: 66 %
Platelets: 203 10*3/uL (ref 150–400)
RBC: 5.03 MIL/uL (ref 4.22–5.81)
RDW: 13.8 % (ref 11.5–15.5)
WBC: 9.8 10*3/uL (ref 4.0–10.5)
nRBC: 0 % (ref 0.0–0.2)

## 2022-12-29 LAB — BASIC METABOLIC PANEL
Anion gap: 9 (ref 5–15)
BUN: 22 mg/dL — ABNORMAL HIGH (ref 6–20)
CO2: 28 mmol/L (ref 22–32)
Calcium: 9.6 mg/dL (ref 8.9–10.3)
Chloride: 102 mmol/L (ref 98–111)
Creatinine, Ser: 1.17 mg/dL (ref 0.61–1.24)
GFR, Estimated: >60 mL/min (ref >60)
Glucose, Bld: 160 mg/dL — ABNORMAL HIGH (ref 70–99)
Potassium: 3.5 mmol/L (ref 3.5–5.1)
Sodium: 139 mmol/L (ref 135–145)

## 2022-12-29 MED ORDER — METHYLPREDNISOLONE SODIUM SUCC 125 MG IJ SOLR
125.0000 mg | Freq: Once | INTRAMUSCULAR | Status: AC
  Administered 2022-12-30: 125 mg via INTRAVENOUS
  Filled 2022-12-29: qty 2

## 2022-12-29 MED ORDER — FAMOTIDINE IN NACL 20-0.9 MG/50ML-% IV SOLN
20.0000 mg | Freq: Once | INTRAVENOUS | Status: AC
  Administered 2022-12-30: 20 mg via INTRAVENOUS
  Filled 2022-12-29: qty 50

## 2022-12-29 NOTE — ED Provider Notes (Signed)
[place] EMERGENCY DEPARTMENT AT [facility] Provider Note   CSN: [id number] Arrival date & time: 12/29/22  2126     History  Chief Complaint  Patient presents with   Allergic Reaction    Edgar Kidd is a 52 y.o. male.  The history is provided by the patient.       Home Medications Prior to Admission medications   Medication Sig Start Date End Date Taking? Authorizing Provider  atorvastatin (LIPITOR) 10 MG tablet Take 1 tablet (10 mg total) by mouth daily. 05/07/22   Wendall Stade, MD  docusate sodium (COLACE) 100 MG capsule Take 1 capsule (100 mg total) by mouth 2 (two) times daily. 09/18/22   Harrie Foreman, PA-C  hydrochlorothiazide (HYDRODIURIL) 25 MG tablet Take 25 mg by mouth daily. 09/06/22   [provider]  HYDROcodone-acetaminophen (NORCO) 5-325 MG tablet Take 1-2 tablets by mouth every 6 (six) hours as needed for moderate pain or severe pain. 09/18/22   Harrie Foreman, PA-C  indapamide (LOZOL) 1.25 MG tablet Take 1.25 mg by mouth daily. 07/31/22   [provider]  metFORMIN (GLUCOPHAGE) 850 MG tablet Take 850 mg by mouth 2 (two) times daily.    [provider]  NON FORMULARY Pt uses a c-pap nightly    [provider]  propranolol (INDERAL) 40 MG tablet Take 40 mg by mouth daily. 09/06/22   [provider]  sulfamethoxazole-trimethoprim (BACTRIM DS) 800-160 MG tablet Take 1 tablet by mouth 2 (two) times daily. Start the day prior to foley removal appointment 09/18/22   Harrie Foreman, PA-C      Allergies    Shellfish allergy    Review of Systems   Review of Systems  Physical Exam Updated Vital Signs BP 126/83 (BP Location: Left Arm)   Pulse 83   Temp 97.8 F (36.6 C)   Resp 20   Ht 6' (1.829 m)   Wt (!) 160.6 kg   SpO2 94%   BMI 48.01 kg/m  Physical Exam  ED Results / Procedures / Treatments   Labs (all labs ordered are listed, but only abnormal results are displayed) Labs Reviewed  BASIC  METABOLIC PANEL - Abnormal; Notable for the following components:      Result Value   Glucose, Bld 160 (*)    BUN 22 (*)    All other components within normal limits  CBC WITH DIFFERENTIAL/PLATELET    EKG None  Radiology No results found.  Procedures Procedures  {Document cardiac monitor, telemetry assessment procedure when appropriate:1}  Medications Ordered in ED Medications  methylPREDNISolone sodium succinate (SOLU-MEDROL) 125 mg/2 mL injection 125 mg (has no administration in time range)  famotidine (PEPCID) IVPB 20 mg premix (has no administration in time range)    ED Course/ Medical Decision Making/ A&P   {   Click here for ABCD2, HEART and other calculatorsREFRESH Note before signing :1}                              Medical Decision Making Amount and/or Complexity of Data Reviewed Labs: ordered.  Risk Prescription drug management.   ***  {Document critical care time when appropriate:1} {Document review of labs and clinical decision tools ie heart score, Chads2Vasc2 etc:1}  {Document your independent review of radiology images, and any outside records:1} {Document your discussion with family members, caretakers, and with consultants:1} {Document social determinants of health affecting pt's care:1} {Document your decision making why  or why not admission, treatments were needed:1} Final Clinical Impression(s) / ED Diagnoses Final diagnoses:  None    Rx / DC Orders ED Discharge Orders     None

## 2022-12-29 NOTE — ED Triage Notes (Signed)
Pt states he having an allergic reaction. Itching and welts all over.  Began 1830 Has taken benadryl 50mg  No difficulty breathing airway intact

## 2022-12-30 MED ORDER — PREDNISONE 10 MG PO TABS: 20.0000 mg | ORAL_TABLET | Freq: Two times a day (BID) | ORAL | 0 refills | Status: DC

## 2022-12-30 NOTE — ED Notes (Signed)
Patient denies pain and is resting comfortably.  

## 2022-12-30 NOTE — Discharge Instructions (Signed)
Begin taking prednisone as prescribed.  Given taking Benadryl 25 mg every 6 hours for the next 3 days.  Return to the emergency department if your symptoms significantly worsen or change.

## 2023-04-10 ENCOUNTER — Other Ambulatory Visit: Payer: Self-pay | Admitting: Cardiovascular Disease

## 2023-07-04 ENCOUNTER — Encounter (HOSPITAL_COMMUNITY): Payer: Self-pay

## 2023-07-04 ENCOUNTER — Ambulatory Visit (HOSPITAL_COMMUNITY): Admission: EM | Admit: 2023-07-04 | Discharge: 2023-07-04 | Disposition: A | Discharge status: Home / Self Care

## 2023-07-04 ENCOUNTER — Ambulatory Visit (INDEPENDENT_AMBULATORY_CARE_PROVIDER_SITE_OTHER)

## 2023-07-04 DIAGNOSIS — M25512 Pain in left shoulder: Secondary | ICD-10-CM

## 2023-07-04 DIAGNOSIS — M7502 Adhesive capsulitis of left shoulder: Secondary | ICD-10-CM | POA: Diagnosis not present

## 2023-07-04 MED ORDER — CELECOXIB 200 MG PO CAPS: 200.0000 mg | ORAL_CAPSULE | Freq: Two times a day (BID) | ORAL | 0 refills | Status: DC | PRN

## 2023-07-04 MED ORDER — DICLOFENAC SODIUM 3 % EX GEL: 1.0000 | Freq: Two times a day (BID) | CUTANEOUS | 0 refills | Status: AC | PRN

## 2023-07-04 NOTE — Discharge Instructions (Addendum)
 Left shoulder pain in early left shoulder capsulitis or frozen left shoulder: Apply ice packs, 30 minutes on, 30 minutes off and repeat as needed.  Provided shoulder exercises with a handout.  Instructed on shoulder exercises.  May use diclofenac  gel, topical application, twice daily if needed.  If diclofenac  gel is not helpful, may use Celebrex, 200 mg, 1 pill twice daily for left shoulder pain.  Do not use the gel and the pills at the same time.  If symptoms persist, needs to see orthopedics or family practice and get further workup.

## 2023-07-04 NOTE — ED Triage Notes (Signed)
 Patient presenting with bilateral shoulder pain onset around 2 weeks ago. Patient fell in the shower and then fell again the next day in the bathroom. Slipped both times getting out of the tub.  Having reduced range of motion in the shoulders with a sharp aching pain, worse in the left shoulder.  Prescriptions or OTC medications tried: Yes- otc pain meds   with little relief

## 2023-07-04 NOTE — ED Provider Notes (Signed)
 [facility]    CSN: [id number] Arrival date & time: 07/04/23  1548      History   Chief Complaint Chief Complaint  Patient presents with   Shoulder Pain    HPI Edgar Kidd  is a 53 y.o. male.   Patient reports that he fell in the bathroom twice over a weekend.  On 06/21/2023, he fell into a wall in the bathroom as he was getting out of his shower/tub combo.  He ended up on the floor.  He hit more on his left shoulder than his right.  Then on Monday, 06/23/2023, he fell backwards and hit both shoulders on the wall and then ended up on the floor in the bathroom again.  He has had a previous right rotator cuff repair.  His right shoulder was sore for at least a week but it seems to be almost completely improved now.  His left shoulder continues with intermittent pain.  But he is having decreased range of motion and pain with range of motion.  He is concerned that 1 shoulder feels better and the other does not.  OTC medications have not helped his pain.   Shoulder Pain Associated symptoms: no back pain and no fever     Past Medical History:  Diagnosis Date   Arthritis    Diabetes mellitus    Diverticulitis    Elevated C-reactive protein (CRP)    Elevated PSA    Gastroesophagitis    GERD (gastroesophageal reflux disease)    History of kidney stones    History of vitamin D deficiency    Hypertension    Obese    Prostate cancer (HCC)    Renal disorder    Sleep apnea    on CPAP     Patient Active Problem List   Diagnosis Date Noted   Prostate cancer (HCC) 09/18/2022    Past Surgical History:  Procedure Laterality Date   CYSTOSCOPY WITH RETROGRADE PYELOGRAM, URETEROSCOPY AND STENT PLACEMENT Left 04/22/2018   Procedure: CYSTOSCOPY WITH RETROGRADE PYELOGRAM, URETEROSCOPY AND STENT PLACEMENT;  Surgeon: Osborn Blaze, MD;  Location: [facility];  Service: Urology;  Laterality: Left;  75 MINS   FINGER SURGERY     right little finger   HOLMIUM LASER APPLICATION  Left 04/22/2018   Procedure: HOLMIUM LASER APPLICATION;  Surgeon: Osborn Blaze, MD;  Location: [facility];  Service: Urology;  Laterality: Left;   KNEE ARTHROSCOPY     LYMPH NODE DISSECTION Bilateral 09/18/2022   Procedure: PELVIC LYMPH NODE DISSECTION;  Surgeon: Melody Spurling., MD;  Location: [facility];  Service: Urology;  Laterality: Bilateral;   PROSTATE BIOPSY     ROBOT ASSISTED LAPAROSCOPIC RADICAL PROSTATECTOMY N/A 09/18/2022   Procedure: XI ROBOTIC ASSISTED LAPAROSCOPIC RADICAL PROSTATECTOMY;  Surgeon: Melody Spurling., MD;  Location: [facility];  Service: Urology;  Laterality: N/A;  3.5 HRS   ROTATOR CUFF REPAIR     TONSILLECTOMY AND ADENOIDECTOMY         Home Medications    Prior to Admission medications   Medication Sig Start Date End Date Taking? Authorizing Provider  celecoxib (CELEBREX) 200 MG capsule Take 1 capsule (200 mg total) by mouth 2 (two) times daily as needed. 07/04/23  Yes Guss Legacy, FNP  clonazePAM (KLONOPIN) 2 MG tablet Take 2 mg by mouth. 07/03/23  Yes [provider]  Diclofenac  Sodium 3 % GEL Apply 1 Application topically 2 (two) times daily as needed (shoulder pain). 07/04/23  Yes Guss Legacy, FNP  FLUoxetine (PROZAC) 20 MG capsule Take 60 mg by mouth every morning. 06/28/23  Yes [provider]  gabapentin (NEURONTIN) 800 MG tablet Take 2,400 mg by mouth at bedtime. 06/08/23  Yes [provider]  hydrochlorothiazide  (HYDRODIURIL ) 25 MG tablet TAKE 1 TABLET EVERY DAY 04/10/23  Yes Nishan, Peter C, MD  indapamide  (LOZOL ) 1.25 MG tablet Take 1.25 mg by mouth daily. 07/31/22  Yes [provider]  NON FORMULARY Pt uses a c-pap nightly   Yes [provider]  propranolol  (INDERAL ) 40 MG tablet TAKE 1 TABLET EVERY DAY 04/10/23  Yes Loyde Rule, MD    Family History History reviewed. No pertinent family history.  Social History Social History   Tobacco Use   Smoking status: Never   Smokeless tobacco: Never   Vaping Use   Vaping status: Never Used  Substance Use Topics   Alcohol use: No   Drug use: No     Allergies   Shellfish allergy   Review of Systems Review of Systems  Constitutional:  Negative for fever.  Respiratory:  Negative for cough.   Cardiovascular:  Negative for chest pain.  Gastrointestinal:  Negative for abdominal pain, constipation, diarrhea, nausea and vomiting.  Musculoskeletal:  Positive for myalgias (Left shoulder pain). Negative for arthralgias and back pain.  Skin:  Negative for color change and rash.  Neurological:  Negative for syncope.  All other systems reviewed and are negative.    Physical Exam Triage Vital Signs ED Triage Vitals  Encounter Vitals Group     BP 07/04/23 1642 125/88     Systolic BP Percentile --      Diastolic BP Percentile --      Pulse Rate 07/04/23 1642 66     Resp 07/04/23 1642 18     Temp 07/04/23 1642 98.3 F (36.8 C)     Temp Source 07/04/23 1642 Oral     SpO2 07/04/23 1642 94 %     Weight --      Height --      Head Circumference --      Peak Flow --      Pain Score 07/04/23 1640 8     Pain Loc --      Pain Education --      Exclude from Growth Chart --    No data found.  Updated Vital Signs BP 125/88 (BP Location: Right Arm)   Pulse 66   Temp 98.3 F (36.8 C) (Oral)   Resp 18   SpO2 94%   Visual Acuity Right Eye Distance:   Left Eye Distance:   Bilateral Distance:    Right Eye Near:   Left Eye Near:    Bilateral Near:     Physical Exam Vitals and nursing note reviewed.  Constitutional:      General: He is not in acute distress.    Appearance: He is well-developed. He is morbidly obese. He is not ill-appearing or toxic-appearing.  HENT:     Head: Normocephalic and atraumatic.     Right Ear: Hearing, tympanic membrane, ear canal and external ear normal.     Left Ear: Hearing, tympanic membrane, ear canal and external ear normal.     Nose: No congestion or rhinorrhea.     Right Sinus: No  maxillary sinus tenderness or frontal sinus tenderness.     Left Sinus: No maxillary sinus tenderness or frontal sinus tenderness.     Mouth/Throat:     Lips: Pink.  Mouth: Mucous membranes are moist.     Pharynx: Uvula midline. No oropharyngeal exudate or posterior oropharyngeal erythema.     Tonsils: No tonsillar exudate.  Eyes:     Conjunctiva/sclera: Conjunctivae normal.     Pupils: Pupils are equal, round, and reactive to light.  Cardiovascular:     Rate and Rhythm: Normal rate and regular rhythm.     Heart sounds: S1 normal and S2 normal. No murmur heard. Pulmonary:     Effort: Pulmonary effort is normal. No respiratory distress.     Breath sounds: Normal breath sounds. No decreased breath sounds, wheezing, rhonchi or rales.  Abdominal:     General: Bowel sounds are normal.     Palpations: Abdomen is soft.     Tenderness: There is no abdominal tenderness.  Musculoskeletal:        General: No swelling.     Right shoulder: Normal.     Left shoulder: Laceration and tenderness (Along the trapezius and deltoid muscles and along the scapula) present. No swelling, deformity or effusion. Decreased range of motion (Decreased extension and rotation of the left shoulder due to pain).     Cervical back: Neck supple.  Lymphadenopathy:     Head:     Right side of head: No submental, submandibular, tonsillar, preauricular or posterior auricular adenopathy.     Left side of head: No submental, submandibular, tonsillar, preauricular or posterior auricular adenopathy.     Cervical: No cervical adenopathy.     Right cervical: No superficial cervical adenopathy.    Left cervical: No superficial cervical adenopathy.  Skin:    General: Skin is warm and dry.     Capillary Refill: Capillary refill takes less than 2 seconds.     Findings: No rash.  Neurological:     Mental Status: He is alert and oriented to person, place, and time.  Psychiatric:        Mood and Affect: Mood normal.       UC Treatments / Results  Labs (all labs ordered are listed, but only abnormal results are displayed) Labs Reviewed - No data to display Basic metabolic panel:12/29/22:    Component Ref Range & Units (hover) 6 mo ago (12/29/22)  Sodium 139  Potassium 3.5  Chloride 102  CO2 28  Glucose, Bld 160 High   Comment: Glucose reference range applies only to samples taken after fasting for at least 8 hours.  BUN 22 High   Creatinine, Ser 1.17  Calcium  9.6  GFR, Estimated >60  Comment: (NOTE) Calculated using the CKD-EPI Creatinine Equation (2021)  Anion gap 9  Comment: Performed at [facility], [address] [place], [place] [address]  Resulting Agency [facility]      EKG   Radiology No results found.  Procedures Procedures (including critical care time)  Medications Ordered in UC Medications - No data to display  Initial Impression / Assessment and Plan / UC Course  I have reviewed the triage vital signs and the nursing notes.  Pertinent labs & imaging results that were available during my care of the patient were reviewed by me and considered in my medical decision making (see chart for details).  Plan of Care: Left shoulder pain and probable early frozen shoulder: Left shoulder film appears negative.  Will update the patient if the radiology review differs.  Educated on shoulder exercises with demonstration in the room and handout given.  Encouraged to do shoulder exercises 4 times a day for couple weeks.  Use either Celebrex 200 mg 1 pill twice daily for shoulder pain or diclofenac  gel topically twice daily for shoulder pain.  Do not use both at the same time.  Do not use any other NSAIDs (such as Advil , Aleve , Motrin , ibuprofen ) while using these medications.  May use a sling for comfort and support.  May apply ice, 30 minutes on, 30 minutes off and repeat as often as needed.  Follow-up with primary care if symptoms do not improve, worsen  or new symptoms occur.  May need referral to orthopedics or physical therapy.  I reviewed the plan of care with the patient and/or the patient's guardian.  The patient and/or guardian had time to ask questions and acknowledged that the questions were answered.  I provided instruction on symptoms or reasons to return here or to go to an ER, if symptoms/condition did not improve, worsened or if new symptoms occurred.  Final Clinical Impressions(s) / UC Diagnoses   Final diagnoses:  Acute pain of left shoulder  Adhesive capsulitis of left shoulder     Discharge Instructions      Left shoulder pain in early left shoulder capsulitis or frozen left shoulder: Apply ice packs, 30 minutes on, 30 minutes off and repeat as needed.  Provided shoulder exercises with a handout.  Instructed on shoulder exercises.  May use diclofenac  gel, topical application, twice daily if needed.  If diclofenac  gel is not helpful, may use Celebrex, 200 mg, 1 pill twice daily for left shoulder pain.  Do not use the gel and the pills at the same time.  If symptoms persist, needs to see orthopedics or family practice and get further workup.   ED Prescriptions     Medication Sig Dispense Auth. Provider   celecoxib (CELEBREX) 200 MG capsule Take 1 capsule (200 mg total) by mouth 2 (two) times daily as needed. 30 capsule Guss Legacy, FNP   Diclofenac  Sodium 3 % GEL Apply 1 Application topically 2 (two) times daily as needed (shoulder pain). 60 g Guss Legacy, FNP      PDMP not reviewed this encounter.   Guss Legacy, FNP 07/04/23 1750

## 2023-07-06 ENCOUNTER — Ambulatory Visit (HOSPITAL_BASED_OUTPATIENT_CLINIC_OR_DEPARTMENT_OTHER): Payer: Self-pay | Admitting: Family Medicine

## 2023-07-10 ENCOUNTER — Other Ambulatory Visit: Payer: Self-pay | Admitting: Cardiovascular Disease

## 2023-08-15 ENCOUNTER — Encounter (HOSPITAL_COMMUNITY): Payer: Self-pay

## 2023-08-15 ENCOUNTER — Ambulatory Visit (HOSPITAL_COMMUNITY)
Admission: EM | Admit: 2023-08-15 | Discharge: 2023-08-15 | Disposition: A | Discharge status: Home / Self Care | Attending: Physician Assistant | Admitting: Physician Assistant

## 2023-08-15 DIAGNOSIS — H109 Unspecified conjunctivitis: Secondary | ICD-10-CM | POA: Diagnosis not present

## 2023-08-15 MED ORDER — FLUORESCEIN SODIUM 1 MG OP STRP
ORAL_STRIP | OPHTHALMIC | Status: AC
Start: 2023-08-15 — End: 2023-08-15
  Filled 2023-08-15: qty 1

## 2023-08-15 MED ORDER — TETRACAINE HCL 0.5 % OP SOLN
OPHTHALMIC | Status: AC
  Filled 2023-08-15: qty 4

## 2023-08-15 MED ORDER — OFLOXACIN 0.3 % OP SOLN: 1.0000 [drp] | Freq: Four times a day (QID) | OPHTHALMIC | 0 refills | Status: DC

## 2023-08-15 NOTE — Discharge Instructions (Addendum)
 We are treating you for an infection in your eye.  Apply ofloxacin  drops 4 times daily.  Wash your hands before hitting medication and do not touch of medication bottle to the eye as this can contaminate the medicine.  You can use lubricating eyedrops/artificial tears for additional symptom relief.  I would like you to follow-up with an eye specialist if your symptoms do not completely go away within a few days.  Call them to schedule an appointment.  If anything worsens and you have eye pain, vision change, fever, nausea, vomiting you need to be seen immediately.

## 2023-08-15 NOTE — ED Provider Notes (Signed)
 [facility]    CSN: [id number] Arrival date & time: 08/15/23  1055      History   Chief Complaint Chief Complaint  Patient presents with   Eye Problem    HPI Edgar Kidd  is a 53 y.o. male.   Patient presents today with a weeklong history of redness and drainage from his right eye.  He denies any foreign body sensation or ocular injury.  He denies any exposure to fine particulate matter or chemicals.  He does report his vision is slightly blurred but believes this is related to tearing.  He does wear glasses but does not wear contacts.  He has been trying Visine without improvement of symptoms.  He denies history of uveitis and denies any additional symptoms including fever, headache, dizziness, nausea, vomiting, body aches.  He is having difficulty with his daily activities as a result of symptoms.  Denies any known sick contacts.  Denies any ocular pain but does report some irritation.    Past Medical History:  Diagnosis Date   Arthritis    Diabetes mellitus    Diverticulitis    Elevated C-reactive protein (CRP)    Elevated PSA    Gastroesophagitis    GERD (gastroesophageal reflux disease)    History of kidney stones    History of vitamin D deficiency    Hypertension    Obese    Prostate cancer (HCC)    Renal disorder    Sleep apnea    on CPAP     Patient Active Problem List   Diagnosis Date Noted   Prostate cancer (HCC) 09/18/2022    Past Surgical History:  Procedure Laterality Date   CYSTOSCOPY WITH RETROGRADE PYELOGRAM, URETEROSCOPY AND STENT PLACEMENT Left 04/22/2018   Procedure: CYSTOSCOPY WITH RETROGRADE PYELOGRAM, URETEROSCOPY AND STENT PLACEMENT;  Surgeon: Alvaro Hummer, MD;  Location: [facility];  Service: Urology;  Laterality: Left;  75 MINS   FINGER SURGERY     right little finger   HOLMIUM LASER APPLICATION Left 04/22/2018   Procedure: HOLMIUM LASER APPLICATION;  Surgeon: Alvaro Hummer, MD;  Location: [facility];  Service: Urology;   Laterality: Left;   KNEE ARTHROSCOPY     LYMPH NODE DISSECTION Bilateral 09/18/2022   Procedure: PELVIC LYMPH NODE DISSECTION;  Surgeon: Alvaro Hummer KATHEE Mickey., MD;  Location: [facility];  Service: Urology;  Laterality: Bilateral;   PROSTATE BIOPSY     ROBOT ASSISTED LAPAROSCOPIC RADICAL PROSTATECTOMY N/A 09/18/2022   Procedure: XI ROBOTIC ASSISTED LAPAROSCOPIC RADICAL PROSTATECTOMY;  Surgeon: Alvaro Hummer KATHEE Mickey., MD;  Location: [facility];  Service: Urology;  Laterality: N/A;  3.5 HRS   ROTATOR CUFF REPAIR     TONSILLECTOMY AND ADENOIDECTOMY         Home Medications    Prior to Admission medications   Medication Sig Start Date End Date Taking? Authorizing Provider  ofloxacin  (OCUFLOX ) 0.3 % ophthalmic solution Place 1 drop into the right eye 4 (four) times daily. 08/15/23  Yes Crespin Forstrom K, PA-C  celecoxib  (CELEBREX ) 200 MG capsule Take 1 capsule (200 mg total) by mouth 2 (two) times daily as needed. 07/04/23   Ival Domino, FNP  clonazePAM (KLONOPIN) 2 MG tablet Take 2 mg by mouth. 07/03/23   [provider]  Diclofenac  Sodium 3 % GEL Apply 1 Application topically 2 (two) times daily as needed (shoulder pain). 07/04/23   Ival Domino, FNP  FLUoxetine (PROZAC) 20 MG capsule Take 60 mg by mouth every morning. 06/28/23   [provider]  gabapentin (NEURONTIN) 800 MG tablet Take 2,400 mg by mouth at bedtime. 06/08/23   [provider]  hydrochlorothiazide  (HYDRODIURIL ) 25 MG tablet TAKE 1 TABLET EVERY DAY 04/10/23   Nishan, Peter C, MD  indapamide  (LOZOL ) 1.25 MG tablet Take 1.25 mg by mouth daily. 07/31/22   [provider]  NON FORMULARY Pt uses a c-pap nightly    [provider]  propranolol  (INDERAL ) 40 MG tablet TAKE 1 TABLET EVERY DAY 04/10/23   Delford Maude BROCKS, MD    Family History History reviewed. No pertinent family history.  Social History Social History   Tobacco Use   Smoking status: Never   Smokeless tobacco: Never  Vaping Use   Vaping  status: Never Used  Substance Use Topics   Alcohol use: Yes    Comment: occasionally   Drug use: No     Allergies   Shellfish allergy   Review of Systems Review of Systems  Constitutional:  Negative for activity change, appetite change, fatigue and fever.  Eyes:  Positive for discharge and redness. Negative for photophobia, pain, itching and visual disturbance.  Gastrointestinal:  Negative for diarrhea, nausea and vomiting.  Musculoskeletal:  Negative for arthralgias and myalgias.  Neurological:  Negative for dizziness, light-headedness and headaches.     Physical Exam Triage Vital Signs ED Triage Vitals  Encounter Vitals Group     BP 08/15/23 1104 (!) 153/93     Girls Systolic BP Percentile --      Girls Diastolic BP Percentile --      Boys Systolic BP Percentile --      Boys Diastolic BP Percentile --      Pulse Rate 08/15/23 1104 71     Resp 08/15/23 1104 16     Temp 08/15/23 1104 97.6 F (36.4 C)     Temp Source 08/15/23 1104 Oral     SpO2 08/15/23 1104 96 %     Weight --      Height --      Head Circumference --      Peak Flow --      Pain Score 08/15/23 1106 5     Pain Loc --      Pain Education --      Exclude from Growth Chart --    No data found.  Updated Vital Signs BP (!) 153/93 (BP Location: Right Arm)   Pulse 71   Temp 97.6 F (36.4 C) (Oral)   Resp 16   SpO2 96%   Visual Acuity Right Eye Distance: 20/40 Left Eye Distance: 20/25 Bilateral Distance: 20/30  Right Eye Near:   Left Eye Near:    Bilateral Near:     Physical Exam Vitals reviewed.  Constitutional:      General: He is awake.     Appearance: Normal appearance. He is well-developed. He is not ill-appearing.     Comments: Very pleasant male appears stated age in no acute distress sitting comfortably in exam room  HENT:     Head: Normocephalic and atraumatic.  Eyes:     General: Lids are everted, no foreign bodies appreciated.     Intraocular pressure: Right eye pressure is  14 mmHg. Measurements were taken using a handheld tonometer.    Extraocular Movements: Extraocular movements intact.     Conjunctiva/sclera:     Right eye: Right conjunctiva is injected.     Left eye: Left conjunctiva is not injected.     Pupils: Pupils are equal, round, and reactive to  light.     Right eye: No corneal abrasion or fluorescein  uptake. Seidel exam negative.     Comments: Patient had improvement of pain following application of tetracaine .  Cardiovascular:     Rate and Rhythm: Normal rate and regular rhythm.     Heart sounds: Normal heart sounds, S1 normal and S2 normal. No murmur heard. Pulmonary:     Effort: Pulmonary effort is normal.     Breath sounds: Normal breath sounds. No stridor. No wheezing, rhonchi or rales.     Comments: Clear to auscultation bilaterally Neurological:     Mental Status: He is alert.  Psychiatric:        Behavior: Behavior is cooperative.      UC Treatments / Results  Labs (all labs ordered are listed, but only abnormal results are displayed) Labs Reviewed - No data to display  EKG   Radiology No results found.  Procedures Procedures (including critical care time)  Medications Ordered in UC Medications - No data to display  Initial Impression / Assessment and Plan / UC Course  I have reviewed the triage vital signs and the nursing notes.  Pertinent labs & imaging results that were available during my care of the patient were reviewed by me and considered in my medical decision making (see chart for details).     Patient is well-appearing, afebrile, nontoxic, nontachycardic.  No corneal abrasion on fluorescein  staining.  Patient did have improvement of symptoms with application of tetracaine .  I am concerned for bacterial conjunctivitis so we will start ofloxacin  drops 4 times daily.  He was instructed to wash his hands before handling medication and avoid touching tip medication bottle to the eye as this can contaminate the  medicine.  If his symptoms or not improving quickly he is to follow-up with ophthalmology.  He was given the contact information for local provider with instruction to call to schedule an appointment.  We discussed that if anything worsens or changes and he has visual disturbance, eye pain, nausea, vomiting, fever, headache he needs to be seen emergently.  Strict return precautions given.  Excuse note provided.  Final Clinical Impressions(s) / UC Diagnoses   Final diagnoses:  Bacterial conjunctivitis of right eye     Discharge Instructions      We are treating you for an infection in your eye.  Apply ofloxacin  drops 4 times daily.  Wash your hands before hitting medication and do not touch of medication bottle to the eye as this can contaminate the medicine.  You can use lubricating eyedrops/artificial tears for additional symptom relief.  I would like you to follow-up with an eye specialist if your symptoms do not completely go away within a few days.  Call them to schedule an appointment.  If anything worsens and you have eye pain, vision change, fever, nausea, vomiting you need to be seen immediately.     ED Prescriptions     Medication Sig Dispense Auth. Provider   ofloxacin  (OCUFLOX ) 0.3 % ophthalmic solution Place 1 drop into the right eye 4 (four) times daily. 5 mL Sebrena Engh K, PA-C      PDMP not reviewed this encounter.   Sherrell Rocky POUR, PA-C 08/15/23 1151

## 2023-08-15 NOTE — ED Triage Notes (Signed)
 Patient here today with c/o right eye redness, pain, and drainage X 7-10 days. He has tried using Visine with no relief.

## 2023-09-12 ENCOUNTER — Telehealth: Payer: Self-pay | Admitting: Cardiovascular Disease

## 2023-09-12 NOTE — Telephone Encounter (Signed)
*  STAT* If patient is at the pharmacy, call can be transferred to refill team.   1. Which medications need to be refilled? (please list name of each medication and dose if known)  propranolol  (INDERAL ) 40 MG tablet TAKE 1 TABLET EVERY DAY   4. Which pharmacy/location (including street and city if local pharmacy) is medication to be sent to? [id number] - [place], [place] - [address]     5. Do they need a 30 day or 90 day supply? 90   Scheduled 9/4

## 2023-09-15 MED ORDER — PROPRANOLOL HCL 40 MG PO TABS: 40.0000 mg | ORAL_TABLET | Freq: Every day | ORAL | 0 refills | Status: DC

## 2023-10-08 NOTE — Progress Notes (Signed)
 CARDIOLOGY CONSULT NOTE       Patient ID: Edgar Kidd  MRN: [id number] DOB/AGE: 1970/03/08 53 y.o.  Admit date: (Not on file) Referring Physician: Dr Bascom Keen  Primary Physician: [facility] Primary Cardiologist: Delford   HPI:  53 y.o. morbidly obese male referred by Dr Bascom for CAD risk first seen by me 05/07/22  No documented history of such. History of DM with A1c 8.1 HTN LDL 81 OSA GERD.  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 Has some paraesthesias in left shoulder but no chest pain Pulse was > 100 in office and ECG with LVH    His BP is up and he stopped taking his Lozol  He is not on statin He has only taking glucophage 850 mg for DM and considering Ozempic for this and weight loss    Calcium  score done 06/20/22 was 0 Noted 4.2 cm ascending thoracic aorta October 6,2024 Aorta 4.3 cm with 4 mm LUL nodule   Has one son doing Avionics and has a 46 yo One daughter working at daycare and going back to school they are 21 and 24   Prostate cancer. With radical prostatectomy August 2024  Still in trucking but moving to logistics/office work in 2 weeks Long discussion about better BS control and weight loss    ROS All other systems reviewed and negative except as noted above  Past Medical History:  Diagnosis Date   Arthritis    Diabetes mellitus    Diverticulitis    Elevated C-reactive protein (CRP)    Elevated PSA    Gastroesophagitis    GERD (gastroesophageal reflux disease)    History of kidney  stones    History of vitamin D deficiency    Hypertension    Obese    Prostate cancer (HCC)    Renal disorder    Sleep apnea    on CPAP     History reviewed. No pertinent family history.  Social History   Socioeconomic History   Marital status: Married    Spouse name: Not on file   Number of children: Not on file   Years of education: Not on file   Highest education level: Not on file  Occupational History   Not on file  Tobacco Use   Smoking status: Never   Smokeless tobacco: Never  Vaping Use   Vaping status: Never Used  Substance and Sexual Activity   Alcohol use: Yes    Comment: occasionally   Drug use: No   Sexual activity: Not on file  Other Topics Concern   Not on file  Social History Narrative   Not on file   Social Drivers of Health   Financial Resource Strain: Not on file  Food Insecurity: No Food Insecurity (09/18/2022)   Hunger Vital Sign    Worried About Running Out of Food in the Last Year: Never true    Ran Out of Food in the Last Year: Never true  Transportation Needs:  No Transportation Needs (09/18/2022)   PRAPARE - Administrator, Civil Service (Medical): No    Lack of Transportation (Non-Medical): No  Physical Activity: Not on file  Stress: Not on file  Social Connections: Not on file  Intimate Partner Violence: Not At Risk (09/18/2022)   Humiliation, Afraid, Rape, and Kick questionnaire    Fear of Current or Ex-Partner: No    Emotionally Abused: No    Physically Abused: No    Sexually Abused: No    Past Surgical History:  Procedure Laterality Date   CYSTOSCOPY WITH RETROGRADE PYELOGRAM, URETEROSCOPY AND STENT PLACEMENT Left 04/22/2018   Procedure: CYSTOSCOPY WITH RETROGRADE PYELOGRAM, URETEROSCOPY AND STENT PLACEMENT;  Surgeon: Alvaro Hummer, MD;  Location: [facility];  Service: Urology;  Laterality: Left;  75 MINS   FINGER SURGERY     right little finger   HOLMIUM LASER APPLICATION Left 04/22/2018   Procedure: HOLMIUM LASER  APPLICATION;  Surgeon: Alvaro Hummer, MD;  Location: [facility];  Service: Urology;  Laterality: Left;   KNEE ARTHROSCOPY     LYMPH NODE DISSECTION Bilateral 09/18/2022   Procedure: PELVIC LYMPH NODE DISSECTION;  Surgeon: Alvaro Hummer KATHEE Mickey., MD;  Location: [facility];  Service: Urology;  Laterality: Bilateral;   PROSTATE BIOPSY     ROBOT ASSISTED LAPAROSCOPIC RADICAL PROSTATECTOMY N/A 09/18/2022   Procedure: XI ROBOTIC ASSISTED LAPAROSCOPIC RADICAL PROSTATECTOMY;  Surgeon: Alvaro Hummer KATHEE Mickey., MD;  Location: [facility];  Service: Urology;  Laterality: N/A;  3.5 HRS   ROTATOR CUFF REPAIR     TONSILLECTOMY AND ADENOIDECTOMY        Current Outpatient Medications:    cetirizine  (ZYRTEC  ALLERGY) 10 MG tablet, Take 1 tablet (10 mg total) by mouth daily., Disp: 30 tablet, Rfl: 0   clonazePAM (KLONOPIN) 2 MG tablet, Take 2 mg by mouth., Disp: , Rfl:    Diclofenac  Sodium 3 % GEL, Apply 1 Application topically 2 (two) times daily as needed (shoulder pain)., Disp: 60 g, Rfl: 0   FLUoxetine (PROZAC) 20 MG capsule, Take 60 mg by mouth every morning., Disp: , Rfl:    gabapentin (NEURONTIN) 800 MG tablet, Take 2,400 mg by mouth at bedtime., Disp: , Rfl:    hydrochlorothiazide  (HYDRODIURIL ) 25 MG tablet, TAKE 1 TABLET EVERY DAY, Disp: 90 tablet, Rfl: 0   indapamide  (LOZOL ) 1.25 MG tablet, Take 1.25 mg by mouth daily., Disp: , Rfl:    NON FORMULARY, Pt uses a c-pap nightly, Disp: , Rfl:    ofloxacin  (OCUFLOX ) 0.3 % ophthalmic solution, Place 1 drop into the right eye 4 (four) times daily., Disp: 5 mL, Rfl: 0   predniSONE  (DELTASONE ) 20 MG tablet, Take 2 tablets (40 mg total) by mouth daily., Disp: 8 tablet, Rfl: 0   propranolol  (INDERAL ) 40 MG tablet, Take 1 tablet (40 mg total) by mouth daily., Disp: 90 tablet, Rfl: 0   celecoxib  (CELEBREX ) 200 MG capsule, Take 1 capsule (200 mg total) by mouth 2 (two) times daily as needed. (Patient not taking: Reported on 10/16/2023), Disp: 30 capsule, Rfl: 0    Physical  Exam: Blood pressure 130/82, pulse 86, height 6' (1.829 m), weight (!) 382 lb (173.3 kg), SpO2 96%.   Affect appropriate Obese black male  HEENT: normal Neck supple with no adenopathy JVP normal no bruits no thyromegaly Lungs clear with no wheezing and good diaphragmatic motion Heart:  S1/S2 no murmur, no rub, gallop or click PMI normal Abdomen: benighn, BS positve, no tenderness, no AAA no bruit.  No  HSM or HJR Distal pulses intact with no bruits Plus 1-2 bilateral  edema Neuro non-focal Skin warm and dry Arthritis both knees    Labs:   Lab Results  Component Value Date   WBC 9.8 12/29/2022   HGB 14.7 12/29/2022   HCT 43.8 12/29/2022   MCV 87.1 12/29/2022   PLT 203 12/29/2022   No results for input(s): NA, K, CL, CO2, BUN, CREATININE, CALCIUM , PROT, BILITOT, ALKPHOS, ALT, AST, GLUCOSE in the last 168 hours.  Invalid input(s): LABALBU Lab Results  Component Value Date   CKTOTAL 896 (H) 09/30/2009   CKMB 2.0 09/30/2009   TROPONINI 0.02        NO INDICATION OF MYOCARDIAL INJURY. 09/30/2009    Lab Results  Component Value Date   CHOL  09/30/2009    85        ATP III CLASSIFICATION:  <200     mg/dL   Desirable  799-760  mg/dL   Borderline High  >=759    mg/dL   High          CHOL  96/76/7989    133        ATP III CLASSIFICATION:  <200     mg/dL   Desirable  799-760  mg/dL   Borderline High  >=759    mg/dL   High          Lab Results  Component Value Date   HDL 33 (L) 09/30/2009   HDL 32 (L) 05/03/2008   Lab Results  Component Value Date   [facility]  09/30/2009    35        Total Cholesterol/HDL:CHD Risk Coronary Heart Disease Risk Table                     Men   Women  1/2 Average Risk   3.4   3.3  Average Risk       5.0   4.4  2 X Average Risk   9.6   7.1  3 X Average Risk  23.4   11.0        Use the calculated Patient Ratio above and the CHD Risk Table to determine the patient's CHD Risk.        ATP III  CLASSIFICATION (LDL):  <100     mg/dL   Optimal  899-870  mg/dL   Near or Above                    Optimal  130-159  mg/dL   Borderline  839-810  mg/dL   High  >809     mg/dL   Very High   LDLCALC  05/03/2008    95        Total Cholesterol/HDL:CHD Risk Coronary Heart Disease Risk Table                     Men   Women  1/2 Average Risk   3.4   3.3  Average Risk       5.0   4.4  2 X Average Risk   9.6   7.1  3 X Average Risk  23.4   11.0        Use the calculated Patient Ratio above and the CHD Risk Table to determine the patient's CHD Risk.        ATP III CLASSIFICATION (LDL):  <100     mg/dL   Optimal  899-870  mg/dL  Near or Above                    Optimal  130-159  mg/dL   Borderline  839-810  mg/dL   High  >809     mg/dL   Very High   Lab Results  Component Value Date   TRIG 84 09/30/2009   TRIG 30 05/03/2008   Lab Results  Component Value Date   CHOLHDL 2.6 09/30/2009   CHOLHDL 4.2 05/03/2008   No results found for: LDLDIRECT    Radiology: No results found.  EKG: ST rate 111 LVH and LAD    ASSESSMENT AND PLAN:   CAD:  risk poorly controlled DM and elevated CRP Latter can be seen soley from poorly controlled DM and metabolic syndrome  His body habitus makes non invasive imaging difficult With calcium  score of 0 no further w/u needed  DM:  Discussed low carb diet.  Target hemoglobin A1c is 6.5 or less.  Needs more intense Rx not just glucophage Refer to Dr Trixie Endocrine Start Jardiance  10 mg and consider Ozempi with endocrine/primary. Check BMET today Prostate cancer:  post radical prostatectomy 09/2022    Tachycardia/HTN:  Improved with inderide.  TTE 06/05/22 EF 70-75% mild LVH  Dilated Aorta:  ? From HTN 4.3 cm on CTA 11/2022. Screen this year with non contrast CT check nodule in LUL as well  Non contrast chest CT BMET starting Jardiance  Refer to Lela Trixie for DM  F/U cardiology in a year  Signed: Maude Emmer 10/16/2023, 3:48 PM

## 2023-10-13 ENCOUNTER — Emergency Department (HOSPITAL_BASED_OUTPATIENT_CLINIC_OR_DEPARTMENT_OTHER)
Admission: EM | Admit: 2023-10-13 | Discharge: 2023-10-13 | Disposition: A | Discharge status: Home / Self Care | Attending: Emergency Medicine | Admitting: Emergency Medicine

## 2023-10-13 ENCOUNTER — Other Ambulatory Visit: Payer: Self-pay

## 2023-10-13 ENCOUNTER — Encounter (HOSPITAL_BASED_OUTPATIENT_CLINIC_OR_DEPARTMENT_OTHER): Payer: Self-pay

## 2023-10-13 DIAGNOSIS — I10 Essential (primary) hypertension: Secondary | ICD-10-CM | POA: Insufficient documentation

## 2023-10-13 DIAGNOSIS — R059 Cough, unspecified: Secondary | ICD-10-CM | POA: Diagnosis present

## 2023-10-13 DIAGNOSIS — E119 Type 2 diabetes mellitus without complications: Secondary | ICD-10-CM | POA: Diagnosis not present

## 2023-10-13 DIAGNOSIS — L509 Urticaria, unspecified: Secondary | ICD-10-CM | POA: Diagnosis not present

## 2023-10-13 MED ORDER — DIPHENHYDRAMINE HCL 50 MG/ML IJ SOLN
12.5000 mg | Freq: Once | INTRAMUSCULAR | Status: AC
  Administered 2023-10-13: 12.5 mg via INTRAVENOUS
  Filled 2023-10-13: qty 1

## 2023-10-13 MED ORDER — PREDNISONE 50 MG PO TABS
60.0000 mg | ORAL_TABLET | Freq: Once | ORAL | Status: AC
  Administered 2023-10-13: 60 mg via ORAL
  Filled 2023-10-13: qty 1

## 2023-10-13 MED ORDER — PREDNISONE 20 MG PO TABS: 40.0000 mg | ORAL_TABLET | Freq: Every day | ORAL | 0 refills | Status: DC

## 2023-10-13 MED ORDER — FAMOTIDINE 20 MG PO TABS
20.0000 mg | ORAL_TABLET | Freq: Once | ORAL | Status: AC
  Administered 2023-10-13: 20 mg via ORAL
  Filled 2023-10-13: qty 1

## 2023-10-13 MED ORDER — CETIRIZINE HCL 10 MG PO TABS: 10.0000 mg | ORAL_TABLET | Freq: Every day | ORAL | 0 refills | Status: AC

## 2023-10-13 NOTE — ED Provider Notes (Signed)
 [place] EMERGENCY DEPARTMENT AT [facility] Provider Note   CSN: [id number] Arrival date & time: 10/13/23  0011     Patient presents with: Allergic Reaction   Edgar Kidd  is a 53 y.o. male.   HPI     This is a 53 year old male who presents with cough bedtime tonight.  Body itching.  Has noted rash over the abdomen, legs, buttocks.  No known culprits.  Does have a known shellfish allergy.  Denies any new medications, foods, detergents, lotions.  Took a dose of Benadryl  prior to arrival.  The systemic symptoms such as shortness of breath or difficulty swallowing.  Prior to Admission medications   Medication Sig Start Date End Date Taking? Authorizing Provider  cetirizine  (ZYRTEC  ALLERGY) 10 MG tablet Take 1 tablet (10 mg total) by mouth daily. 10/13/23  Yes Gearldine Looney, Charmaine FALCON, MD  predniSONE  (DELTASONE ) 20 MG tablet Take 2 tablets (40 mg total) by mouth daily. 10/13/23  Yes Tobie Perdue, Charmaine FALCON, MD  celecoxib  (CELEBREX ) 200 MG capsule Take 1 capsule (200 mg total) by mouth 2 (two) times daily as needed. 07/04/23   Ival Domino, FNP  clonazePAM (KLONOPIN) 2 MG tablet Take 2 mg by mouth. 07/03/23   [provider]  Diclofenac  Sodium 3 % GEL Apply 1 Application topically 2 (two) times daily as needed (shoulder pain). 07/04/23   Ival Domino, FNP  FLUoxetine (PROZAC) 20 MG capsule Take 60 mg by mouth every morning. 06/28/23   [provider]  gabapentin (NEURONTIN) 800 MG tablet Take 2,400 mg by mouth at bedtime. 06/08/23   [provider]  hydrochlorothiazide  (HYDRODIURIL ) 25 MG tablet TAKE 1 TABLET EVERY DAY 04/10/23   Nishan, Peter C, MD  indapamide  (LOZOL ) 1.25 MG tablet Take 1.25 mg by mouth daily. 07/31/22   [provider]  NON FORMULARY Pt uses a c-pap nightly    [provider]  ofloxacin  (OCUFLOX ) 0.3 % ophthalmic solution Place 1 drop into the right eye 4 (four) times daily. 08/15/23   Raspet, Erin K, PA-C  propranolol  (INDERAL )  40 MG tablet Take 1 tablet (40 mg total) by mouth daily. 09/15/23   Delford Maude BROCKS, MD    Allergies: Shellfish allergy    Review of Systems  Constitutional:  Negative for fever.  Respiratory:  Negative for shortness of breath.   Cardiovascular:  Negative for chest pain.  Gastrointestinal:  Negative for abdominal pain.  Skin:  Positive for rash.  All other systems reviewed and are negative.   Updated Vital Signs BP (!) 134/108   Pulse 92   Temp 98 F (36.7 C)   Resp 18   Ht 1.829 m (6')   Wt (!) 166 kg   SpO2 94%   BMI 49.64 kg/m   Physical Exam Vitals and nursing note reviewed.  Constitutional:      Appearance: He is well-developed. He is obese. He is not ill-appearing.  HENT:     Head: Normocephalic and atraumatic.  Eyes:     Pupils: Pupils are equal, round, and reactive to light.  Cardiovascular:     Rate and Rhythm: Normal rate and regular rhythm.     Heart sounds: Normal heart sounds. No murmur heard. Pulmonary:     Effort: Pulmonary effort is normal. No respiratory distress.     Breath sounds: Normal breath sounds. No wheezing.  Abdominal:     Palpations: Abdomen is soft.     Tenderness: There is no abdominal tenderness.  Musculoskeletal:  Cervical back: Neck supple.  Lymphadenopathy:     Cervical: No cervical adenopathy.  Skin:    General: Skin is warm and dry.     Comments: Urticarial type rash noted over the left leg and back  Neurological:     Mental Status: He is alert and oriented to person, place, and time.  Psychiatric:        Mood and Affect: Mood normal.     (all labs ordered are listed, but only abnormal results are displayed) Labs Reviewed - No data to display  EKG: None  Radiology: No results found.   Procedures   Medications Ordered in the ED  famotidine  (PEPCID ) tablet 20 mg (20 mg Oral Given 10/13/23 0046)  predniSONE  (DELTASONE ) tablet 60 mg (60 mg Oral Given 10/13/23 0046)  diphenhydrAMINE  (BENADRYL ) injection 12.5 mg (12.5  mg Intravenous Given 10/13/23 0134)                                    Medical Decision Making Risk OTC drugs. Prescription drug management.   This patient presents to the ED for concern of hives, this involves an extensive number of treatment options, and is a complaint that carries with it a high risk of complications and morbidity.  I considered the following differential and admission for this acute, potentially life threatening condition.  The differential diagnosis includes allergic reaction, viral etiology  MDM:    This is a 53 year old male who presents with hives.  No known trigger.  He is nontoxic and vital signs are notable for blood pressure 134/108.  He has no other signs or symptoms of anaphylaxis or systemic response.  Patient was given prednisone  and Pepcid  as well as a dose of Benadryl .  Had some progressive improvement.  Recommend daily Zyrtec  and a short course of prednisone .  Unclear trigger.  We discussed return precautions.  No indication for epinephrine.  (Labs, imaging, consults)  Labs: I Ordered, and personally interpreted labs.  The pertinent results include: None  Imaging Studies ordered: I ordered imaging studies including none I independently visualized and interpreted imaging. I agree with the radiologist interpretation  Additional history obtained from chart review.  External records from outside source obtained and reviewed including prior evaluations  Cardiac Monitoring: The patient was maintained on a cardiac monitor.  If on the cardiac monitor, I personally viewed and interpreted the cardiac monitored which showed an underlying rhythm of: Sinus  Reevaluation: After the interventions noted above, I reevaluated the patient and found that they have :improved  Social Determinants of Health:  lives independently  Disposition: Discharge  Co morbidities that complicate the patient evaluation  Past Medical History:  Diagnosis Date   Arthritis     Diabetes mellitus    Diverticulitis    Elevated C-reactive protein (CRP)    Elevated PSA    Gastroesophagitis    GERD (gastroesophageal reflux disease)    History of kidney stones    History of vitamin D deficiency    Hypertension    Obese    Prostate cancer (HCC)    Renal disorder    Sleep apnea    on CPAP      Medicines Meds ordered this encounter  Medications   famotidine  (PEPCID ) tablet 20 mg   predniSONE  (DELTASONE ) tablet 60 mg   diphenhydrAMINE  (BENADRYL ) injection 12.5 mg   predniSONE  (DELTASONE ) 20 MG tablet    Sig: Take 2 tablets (40  mg total) by mouth daily.    Dispense:  8 tablet    Refill:  0   cetirizine  (ZYRTEC  ALLERGY) 10 MG tablet    Sig: Take 1 tablet (10 mg total) by mouth daily.    Dispense:  30 tablet    Refill:  0    I have reviewed the patients home medicines and have made adjustments as needed  Problem List / ED Course: Problem List Items Addressed This Visit   None Visit Diagnoses       Hives    -  Primary                Final diagnoses:  Hives    ED Discharge Orders          Ordered    predniSONE  (DELTASONE ) 20 MG tablet  Daily        10/13/23 0223    cetirizine  (ZYRTEC  ALLERGY) 10 MG tablet  Daily        10/13/23 0223               Bari Charmaine FALCON, MD 10/13/23 0225

## 2023-10-13 NOTE — Discharge Instructions (Signed)
 You were seen today for hives.  Sometimes this is allergic in nature or viral in nature.  Take medications as prescribed.  Avoid any obvious triggers.

## 2023-10-13 NOTE — ED Triage Notes (Signed)
 Pt POV reporting possible allergic reaction, ate around 1900, was getting ready for bed and became itchy, hives noted all over body. Pt denies SOB, no throat swelling noted. Known shellfish allergy with hx of anaphylaxis.

## 2023-10-14 ENCOUNTER — Encounter (HOSPITAL_BASED_OUTPATIENT_CLINIC_OR_DEPARTMENT_OTHER): Payer: Self-pay

## 2023-10-16 ENCOUNTER — Encounter: Payer: Self-pay | Admitting: Cardiovascular Disease

## 2023-10-16 ENCOUNTER — Ambulatory Visit: Attending: Cardiovascular Disease | Admitting: Cardiovascular Disease

## 2023-10-16 ENCOUNTER — Telehealth: Payer: Self-pay

## 2023-10-16 VITALS — BP 130/82 | HR 86 | Ht 72.0 in | Wt 382.0 lb

## 2023-10-16 DIAGNOSIS — I2510 Atherosclerotic heart disease of native coronary artery without angina pectoris: Secondary | ICD-10-CM | POA: Diagnosis not present

## 2023-10-16 DIAGNOSIS — E118 Type 2 diabetes mellitus with unspecified complications: Secondary | ICD-10-CM

## 2023-10-16 DIAGNOSIS — I77819 Aortic ectasia, unspecified site: Secondary | ICD-10-CM

## 2023-10-16 DIAGNOSIS — R000 Tachycardia, unspecified: Secondary | ICD-10-CM

## 2023-10-16 DIAGNOSIS — I2583 Coronary atherosclerosis due to lipid rich plaque: Secondary | ICD-10-CM

## 2023-10-16 DIAGNOSIS — I10 Essential (primary) hypertension: Secondary | ICD-10-CM

## 2023-10-16 DIAGNOSIS — I712 Thoracic aortic aneurysm, without rupture, unspecified: Secondary | ICD-10-CM

## 2023-10-16 LAB — BASIC METABOLIC PANEL WITH GFR
BUN/Creatinine Ratio: 14 (ref 9–20)
BUN: 18 mg/dL (ref 6–24)
CO2: 26 mmol/L (ref 20–29)
Calcium: 9.4 mg/dL (ref 8.7–10.2)
Chloride: 103 mmol/L (ref 96–106)
Creatinine, Ser: 1.28 mg/dL — ABNORMAL HIGH (ref 0.76–1.27)
Glucose: 134 mg/dL — ABNORMAL HIGH (ref 70–99)
Potassium: 4 mmol/L (ref 3.5–5.2)
Sodium: 142 mmol/L (ref 134–144)
eGFR: 66 mL/min/1.73 (ref >59)

## 2023-10-16 MED ORDER — EMPAGLIFLOZIN 10 MG PO TABS: 10.0000 mg | ORAL_TABLET | Freq: Every day | ORAL | 11 refills | Status: DC

## 2023-10-16 MED ORDER — EMPAGLIFLOZIN 10 MG PO TABS: 10.0000 mg | ORAL_TABLET | Freq: Every day | ORAL | 0 refills | Status: DC

## 2023-10-16 NOTE — Telephone Encounter (Signed)
 Medication name/dosage: Samples List: Jardiance  10 mg  Administration instructions: Take one daily by mouth  Reason for samples: Reason for samples: new start  Ordering provider: Dr. Delford  *Once above information entered, route the phone encounter to [facility] SAMPLES and send Teams message to team member assigned to Samples for the day.

## 2023-10-16 NOTE — Patient Instructions (Addendum)
 Medication Instructions:  Your physician has recommended you make the following change in your medication:   1-Take Jardiance  10 mg by mouth daily  *If you need a refill on your cardiac medications before your next appointment, please call your pharmacy*  Lab Work: Your physician recommends that you have lab work today. BMET  If you have labs (blood work) drawn today and your tests are completely normal, you will receive your results only by: MyChart Message (if you have MyChart) OR A paper copy in the mail If you have any lab test that is abnormal or we need to change your treatment, we will call you to review the results.  Testing/Procedures: Cardiac CT scanning, (CAT scanning), is a noninvasive, special x-ray that produces cross-sectional images of the body using x-rays and a computer. CT scans help physicians diagnose and treat medical conditions. For some CT exams, a contrast material is used to enhance visibility in the area of the body being studied. CT scans provide greater clarity and reveal more details than regular x-ray exams.  Follow-Up: At [facility], you and your health needs are our priority.  As part of our continuing mission to provide you with exceptional heart care, our providers are all part of one team.  This team includes your primary Cardiologist (physician) and Advanced Practice Providers or APPs (Physician Assistants and Nurse Practitioners) who all work together to provide you with the care you need, when you need it.  Your next appointment:   1 year(s)  Provider:   Maude Emmer, MD    We recommend signing up for the patient portal called MyChart.  Sign up information is provided on this After Visit Summary.  MyChart is used to connect with patients for Virtual Visits (Telemedicine).  Patients are able to view lab/test results, encounter notes, upcoming appointments, etc.  Non-urgent messages can be sent to your provider as well.   To learn more  about what you can do with MyChart, go to [email or web address].   You have been referred to Endocrinologist, their office will call you to schedule.

## 2023-10-17 ENCOUNTER — Ambulatory Visit: Payer: Self-pay | Admitting: Cardiovascular Disease

## 2023-10-17 ENCOUNTER — Other Ambulatory Visit (HOSPITAL_COMMUNITY): Payer: Self-pay

## 2023-10-17 NOTE — Telephone Encounter (Signed)
 Pharmacy Patient Advocate Encounter   Received notification from Physician's Office that prior authorization for JARDIANCE  is required/requested.   Insurance verification completed.   The patient is insured through [facility] .   Per test claim: Refill too soon. PA is not needed at this time. Medication was filled 10/16/23. Next eligible fill date is 11/08/23.

## 2023-12-21 ENCOUNTER — Other Ambulatory Visit: Payer: Self-pay | Admitting: Cardiovascular Disease

## 2024-01-02 ENCOUNTER — Encounter: Payer: Self-pay | Admitting: Internal Medicine

## 2024-01-02 ENCOUNTER — Ambulatory Visit: Admitting: Internal Medicine

## 2024-01-02 ENCOUNTER — Other Ambulatory Visit

## 2024-01-02 VITALS — BP 120/80 | HR 72 | Ht 72.0 in | Wt 376.0 lb

## 2024-01-02 DIAGNOSIS — Z7985 Long-term (current) use of injectable non-insulin antidiabetic drugs: Secondary | ICD-10-CM

## 2024-01-02 DIAGNOSIS — E1165 Type 2 diabetes mellitus with hyperglycemia: Secondary | ICD-10-CM

## 2024-01-02 DIAGNOSIS — Z7984 Long term (current) use of oral hypoglycemic drugs: Secondary | ICD-10-CM

## 2024-01-02 DIAGNOSIS — E785 Hyperlipidemia, unspecified: Secondary | ICD-10-CM | POA: Insufficient documentation

## 2024-01-02 DIAGNOSIS — E66813 Obesity, class 3: Secondary | ICD-10-CM | POA: Insufficient documentation

## 2024-01-02 DIAGNOSIS — E1159 Type 2 diabetes mellitus with other circulatory complications: Secondary | ICD-10-CM | POA: Insufficient documentation

## 2024-01-02 LAB — POCT GLYCOSYLATED HEMOGLOBIN (HGB A1C): Hemoglobin A1C: 6.7 % — AB (ref 4.0–5.6)

## 2024-01-02 MED ORDER — FREESTYLE LIBRE 3 PLUS SENSOR MISC: 1.0000 | 3 refills | Status: AC

## 2024-01-02 MED ORDER — EMPAGLIFLOZIN 10 MG PO TABS: 10.0000 mg | ORAL_TABLET | Freq: Every day | ORAL | 3 refills | Status: AC

## 2024-01-02 MED ORDER — ACCU-CHEK SOFTCLIX LANCETS MISC
12 refills | Status: AC
Start: 2024-01-02 — End: ?

## 2024-01-02 MED ORDER — TIRZEPATIDE 2.5 MG/0.5ML ~~LOC~~ SOAJ: 2.5000 mg | SUBCUTANEOUS | 3 refills | Status: AC

## 2024-01-02 MED ORDER — ACCU-CHEK GUIDE TEST VI STRP
ORAL_STRIP | 12 refills | Status: AC
Start: 2024-01-02 — End: ?

## 2024-01-02 MED ORDER — ACCU-CHEK GUIDE W/DEVICE KIT
PACK | 0 refills | Status: AC
Start: 2024-01-02 — End: ?

## 2024-01-02 NOTE — Progress Notes (Signed)
 Patient ID: Edgar Kidd , male   DOB: Jun 12, 1970, 53 y.o.   MRN: [id number]  HPI: Edgar Kidd  is a 53 y.o.-year-old male, referred by his cardiologist, Dr. Delford, for management of DM2, dx in 2011, non-insulin -dependent, uncontrolled, with complications (CAD, mild CKD, PN).  Patient is seen in the cardiology clinic for his coronary artery disease.  He saw Dr. Nishan in 10/2023 who started him on Jardiance  and also referred him to endocrinology for further diabetes control.    He does mention that he took the samples of Jardiance  but this was not covered by insurance so he is now off the medication.  He mentions changing his diet and also going to the gym lately >> less carbs, less meat, more veggies, healthier snacks, split meals throughout the day, mostly skips dinner. He goes to the gym and walks in the pool. He cannot do more intense exercise due to knee pain. To qualify for knee replacement surgery, he needs a BMI of <30.  Reviewed HbA1c: 11/12/2022: HbA1c 9.7% Lab Results  Component Value Date   HGBA1C 10.1 (H) 09/13/2022   HGBA1C 7.3 (H) 04/20/2018   HGBA1C (H) 09/30/2009    6.7 (NOTE)                                                                       According to the ADA Clinical Practice Recommendations for 2011, when HbA1c is used as a screening test:   >=6.5%   Diagnostic of Diabetes Mellitus           (if abnormal result  is confirmed)  5.7-6.4%   Increased risk of developing Diabetes Mellitus  References:Diagnosis and Classification of Diabetes Mellitus,Diabetes Care,2011,34(Suppl 1):S62-S69 and Standards of Medical Care in         Diabetes - 2011,Diabetes Care,2011,34  (Suppl 1):S11-S61.   HGBA1C (H) 05/03/2008    6.5 (NOTE)   The ADA recommends the following therapeutic goal for glycemic   control related to Hgb A1C measurement:   Goal of Therapy:   < 7.0% Hgb A1C   Reference: American Diabetes Association: Clinical Practice   Recommendations 2008, Diabetes Care,   2008, 31:(Suppl 1).    Pt is on a regimen of: - Jardiance  10 mg before b'fast - started 10/2023 by cardiology - ran out of samples, was not covered He tried metformin >> severe diarrhea; tried the ER formulation also, with same result. He tried Ozempic >> gas, belching.  Pt checks his sugars 0-1x a day a day and they are: - am: 92 (after gym), 110-130 - 2h after b'fast: n/c - before lunch: n/c - 2h after lunch: n/c - before dinner: n/c - 2h after dinner: n/c - bedtime: n/c - nighttime: n/c Lowest sugar was 92; ? hypoglycemia awareness. Highest sugar was 240 >> 165.  Glucometer: none, uses his wife's  Pt's meals are: - Breakfast: granola + yoghurt + fruit - Lunch: wrap sandwitch - Dinner: skip  - Snacks: in the pm: beef jerky, puffs + sparkling mineral water, + hot tea (unsweet)  - + mild CKD, last BUN/creatinine:  Lab Results  Component Value Date   BUN 18 10/16/2023   BUN 22 (H) 12/29/2022   CREATININE 1.28 (H) 10/16/2023   CREATININE  1.17 12/29/2022   No results found for: MICRALBCREAT  - + Dyslipidemia; last set of lipids:  Lab Results  Component Value Date   CHOL  09/30/2009    85        ATP III CLASSIFICATION:  <200     mg/dL   Desirable  799-760  mg/dL   Borderline High  >=759    mg/dL   High          HDL 33 (L) 09/30/2009   LDLCALC  09/30/2009    35        Total Cholesterol/HDL:CHD Risk Coronary Heart Disease Risk Table                     Men   Women  1/2 Average Risk   3.4   3.3  Average Risk       5.0   4.4  2 X Average Risk   9.6   7.1  3 X Average Risk  23.4   11.0        Use the calculated Patient Ratio above and the CHD Risk Table to determine the patient's CHD Risk.        ATP III CLASSIFICATION (LDL):  <100     mg/dL   Optimal  899-870  mg/dL   Near or Above                    Optimal  130-159  mg/dL   Borderline  839-810  mg/dL   High  >809     mg/dL   Very High   TRIG 84 91/79/7988   CHOLHDL 2.6 09/30/2009  She is not on  a statin.  - last eye exam was in 06/2023. No DR reportedly - Dr. Octavia.   - no numbness and tingling in his feet.   He is on gabapentin - for anxiety and helps with sleep.  Pt has FH of DM in M, MGM, PGM.  He also has a history of prostate cancer, hypertension, GERD, gastroesophagitis, diverticulitis, kidney stones, adhesive capsulitis.  He also has OSA  - on CPAP.  ROS: Constitutional: no weight gain, no weight loss, + decreased appetite, no fatigue, no subjective hyperthermia, no subjective hypothermia, + increased urination and nocturia, + poor sleep Eyes: no blurry vision, no xerophthalmia ENT: no sore throat, no nodules palpated in neck, no dysphagia, no odynophagia, no hoarseness, no tinnitus, no hypoacusis Cardiovascular: no CP, no SOB, no palpitations, no leg swelling Respiratory: no cough, no SOB, + wheezing Gastrointestinal: no N, no V, no D, + C, + acid reflux Musculoskeletal: + muscle, + joint aches Skin: + rash, no hair loss Neurological: no tremors, no numbness or tingling/no dizziness/no HAs Psychiatric: no depression, + anxiety + Difficulty with erections, blood + low libido  Past Medical History:  Diagnosis Date   Arthritis    Diabetes mellitus    Diverticulitis    Elevated C-reactive protein (CRP)    Elevated PSA    Gastroesophagitis    GERD (gastroesophageal reflux disease)    History of kidney stones    History of vitamin D deficiency    Hypertension    Obese    Prostate cancer (HCC)    Renal disorder    Sleep apnea    on CPAP    Past Surgical History:  Procedure Laterality Date   CYSTOSCOPY WITH RETROGRADE PYELOGRAM, URETEROSCOPY AND STENT PLACEMENT Left 04/22/2018   Procedure: CYSTOSCOPY WITH RETROGRADE PYELOGRAM, URETEROSCOPY AND STENT PLACEMENT;  Surgeon: Alvaro Hummer, MD;  Location: [facility];  Service: Urology;  Laterality: Left;  75 MINS   FINGER SURGERY     right little finger   HOLMIUM LASER APPLICATION Left 04/22/2018   Procedure:  HOLMIUM LASER APPLICATION;  Surgeon: Alvaro Hummer, MD;  Location: [facility];  Service: Urology;  Laterality: Left;   KNEE ARTHROSCOPY     LYMPH NODE DISSECTION Bilateral 09/18/2022   Procedure: PELVIC LYMPH NODE DISSECTION;  Surgeon: Alvaro Hummer KATHEE Mickey., MD;  Location: [facility];  Service: Urology;  Laterality: Bilateral;   PROSTATE BIOPSY     ROBOT ASSISTED LAPAROSCOPIC RADICAL PROSTATECTOMY N/A 09/18/2022   Procedure: XI ROBOTIC ASSISTED LAPAROSCOPIC RADICAL PROSTATECTOMY;  Surgeon: Alvaro Hummer KATHEE Mickey., MD;  Location: [facility];  Service: Urology;  Laterality: N/A;  3.5 HRS   ROTATOR CUFF REPAIR     TONSILLECTOMY AND ADENOIDECTOMY     Social History   Socioeconomic History   Marital status: Married    Spouse name: Not on file   Number of children: Not on file   Years of education: Not on file   Highest education level: Not on file  Occupational History   Not on file  Tobacco Use   Smoking status: Never   Smokeless tobacco: Never  Vaping Use   Vaping status: Never Used  Substance and Sexual Activity   Alcohol use: Yes    Comment: occasionally   Drug use: No   Sexual activity: Not on file  Other Topics Concern   Not on file  Social History Narrative   Not on file   Social Drivers of Health   Financial Resource Strain: Not on file  Food Insecurity: No Food Insecurity (09/18/2022)   Hunger Vital Sign    Worried About Running Out of Food in the Last Year: Never true    Ran Out of Food in the Last Year: Never true  Transportation Needs: No Transportation Needs (09/18/2022)   PRAPARE - Administrator, Civil Service (Medical): No    Lack of Transportation (Non-Medical): No  Physical Activity: Not on file  Stress: Not on file  Social Connections: Not on file  Intimate Partner Violence: Not At Risk (09/18/2022)   Humiliation, Afraid, Rape, and Kick questionnaire    Fear of Current or Ex-Partner: No    Emotionally Abused: No    Physically Abused: No    Sexually Abused: No    Current Outpatient Medications on File Prior to Visit  Medication Sig Dispense Refill   celecoxib  (CELEBREX ) 200 MG capsule Take 1 capsule (200 mg total) by mouth 2 (two) times daily as needed. (Patient not taking: Reported on 10/16/2023) 30 capsule 0   cetirizine  (ZYRTEC  ALLERGY) 10 MG tablet Take 1 tablet (10 mg total) by mouth daily. 30 tablet 0   clonazePAM (KLONOPIN) 2 MG tablet Take 2 mg by mouth.     Diclofenac  Sodium 3 % GEL Apply 1 Application topically 2 (two) times daily as needed (shoulder pain). 60 g 0   empagliflozin  (JARDIANCE ) 10 MG TABS tablet Take 1 tablet (10 mg total) by mouth daily before breakfast. 30 tablet 11   empagliflozin  (JARDIANCE ) 10 MG TABS tablet Take 1 tablet (10 mg total) by mouth daily before breakfast. 14 tablet 0   empagliflozin  (JARDIANCE ) 10 MG TABS tablet Take 1 tablet (10 mg total) by mouth daily before breakfast. 7 tablet 0   empagliflozin  (JARDIANCE ) 10 MG TABS tablet Take 1 tablet (10 mg total) by mouth daily before  breakfast. 7 tablet 0   FLUoxetine (PROZAC) 20 MG capsule Take 60 mg by mouth every morning.     gabapentin (NEURONTIN) 800 MG tablet Take 2,400 mg by mouth at bedtime.     hydrochlorothiazide  (HYDRODIURIL ) 25 MG tablet TAKE 1 TABLET EVERY DAY 90 tablet 0   indapamide  (LOZOL ) 1.25 MG tablet Take 1.25 mg by mouth daily.     NON FORMULARY Pt uses a c-pap nightly     ofloxacin  (OCUFLOX ) 0.3 % ophthalmic solution Place 1 drop into the right eye 4 (four) times daily. 5 mL 0   predniSONE  (DELTASONE ) 20 MG tablet Take 2 tablets (40 mg total) by mouth daily. 8 tablet 0   propranolol  (INDERAL ) 40 MG tablet TAKE 1 TABLET BY MOUTH EVERY DAY 90 tablet 3   No current facility-administered medications on file prior to visit.   Allergies  Allergen Reactions   Shellfish Allergy Anaphylaxis, Hives and Swelling   History reviewed. No pertinent family history.  PE: BP 120/80   Pulse 72   Ht 6' (1.829 m)   Wt (!) 376 lb (170.6 kg)   SpO2 94%    BMI 50.99 kg/m  Wt Readings from Last 3 Encounters:  01/02/24 (!) 376 lb (170.6 kg)  10/16/23 (!) 382 lb (173.3 kg)  10/13/23 (!) 366 lb (166 kg)   Constitutional: obese, in NAD Eyes:  EOMI, no exophthalmos ENT: no neck masses, no cervical lymphadenopathy Cardiovascular: RRR, No MRG Respiratory: CTA B Musculoskeletal: no deformities Skin:no rashes Neurological: no tremor with outstretched hands  ASSESSMENT: 1. DM2, non-insulin -dependent, uncontrolled, with complications: - CAD, with CAC 0 06/20/2022 - Thoracic aortic aneurysm - CKD stage II - Peripheral neuropathy - Adhesive capsulitis  2.  Dyslipidemia  3.  Obesity class III  PLAN:  1. Patient with long-standing, uncontrolled diabetes, on oral antidiabetic regimen, which became insufficient.  He was not on any diabetic medications until he saw cardiology 10/2023 and he was started on SGLT2 inhibitor, Jardiance  low-dose, 10 mg daily.  However, he mentions that beyond getting the samples from cardiology, he was not able to refill the prescription as this was not covered by his insurance. -I do not have a recent HbA1c for him.  Latest HbA1c was 9.7% in 11/2022.  Previous HbA1c from 09/2022 was very high, at 10.1%.  However, he mentions that after Let's, he started to improve his diet and to exercise by walking in the pool.  He cannot do more intense exercise due to knee pain.  He will need knee surgery but he has to be out of the obesity category to qualify for this.  At today's visit, surprisingly, his HbA1c improved significantly, and this is 6.7%. -At today's visit we discussed about continuing to improve diet.  He is interested in a referral to nutrition, which I placed today. -Also, we discussed about the many benefits of SGLT2 inhibitors and I sent a new prescription for Jardiance  to his pharmacy.  He may need a preauthorization for this.  If not covered, we can try Farxiga/generic dapagliflozin.  We also discussed about trying an  incretin mimetic, Mounjaro , which can help with diabetes control, heart disease, weight loss, and sleep apnea.  We discussed about benefits and possible side effects and he agrees to try this at a low dose and increase as tolerated.  Of note, he was previously on Ozempic with which he had GI symptoms (gas, eructations), but we discussed the Mounjaro  is usually slightly better tolerated than Ozempic and there is  no cross-reactivity between members of this medication class.  He has no history of pancreatitis or family history of medullary thyroid cancer. -We also sent a prescription for a glucometer to his pharmacy and he also agrees to try to get a glucose sensor.  His wife has one and he is familiar with it.  Also, he tells me that his wife is taking Trulicity injections and he is the one that gives her the injections so he is proficient in administering Mounjaro , which has the same type of pen as Trulicity. - I suggested to:  Patient Instructions  Please restart: - Jardiance  10 mg before breakfast  Start: - Mounjaro  2.5 mg weekly. Please let me know in ~3 weeks if we can increase the dose.   - advised him to check sugars at different times of the day - check 4x a day, rotating checks - given sugar log - discussed about CBG targets for treatment: 80-130 mg/dL before meals and <819 mg/dL after meals; target YaJ8r <7%. - given foot care handout and explained the principles  - given instructions for hypoglycemia management 15-15 rule  - advised for yearly eye exams  - Return to clinic in 3 mo   2.  Dyslipidemia - Reviewed latest lipid panel: fractions were at goal in 11/2022, he previously had a low HDL in 2011 - He is not on a statin. - He is due for another lipid panel.  Will check this today (he is fasting).  We discussed that he will most likely need a statin afterwards.  3.  Obesity class III - He gained approximately 30 pounds in the last year, BMI is over 40  - he also has OSA- on  CPAP - We will start back on Jardiance  which should help with weight loss, but he needs more profound weight loss so at today's visit we discussed about starting Mounjaro , which is the strongest agent in the incretin mimetic class.  I am hoping that this is covered by his insurance.  Will start at a low dose and increase as tolerated.  Orders Placed This Encounter  Procedures   Microalbumin / creatinine urine ratio   Lipid Panel w/reflex Direct LDL   Amb ref to Medical Nutrition Therapy-MNT   POCT glycosylated hemoglobin (Hb A1C)   Lela Fendt, MD PhD [facility] Endocrinology

## 2024-01-02 NOTE — Patient Instructions (Addendum)
 Please restart: - Jardiance  10 mg before breakfast  Start: - Mounjaro  2.5 mg weekly. Please let me know in ~3 weeks if we can increase the dose.  PATIENT INSTRUCTIONS FOR TYPE 2 DIABETES:  DIET AND EXERCISE Diet and exercise is an important part of diabetic treatment.  We recommended aerobic exercise in the form of brisk walking (working between 40-60% of maximal aerobic capacity, similar to brisk walking) for 150 minutes per week (such as 30 minutes five days per week) along with 3 times per week performing 'resistance' training (using various gauge rubber tubes with handles) 5-10 exercises involving the major muscle groups (upper body, lower body and core) performing 10-15 repetitions (or near fatigue) each exercise. Start at half the above goal but build slowly to reach the above goals. If limited by weight, joint pain, or disability, we recommend daily walking in a swimming pool with water up to waist to reduce pressure from joints while allow for adequate exercise.    BLOOD GLUCOSES Monitoring your blood glucoses is important for continued management of your diabetes. Please check your blood glucoses 2-4 times a day: fasting, before meals and at bedtime (you can rotate these measurements - e.g. one day check before the 3 meals, the next day check before 2 of the meals and before bedtime, etc.).   HYPOGLYCEMIA (low blood sugar) Hypoglycemia is usually a reaction to not eating, exercising, or taking too much insulin / other diabetes drugs.  Symptoms include tremors, sweating, hunger, confusion, headache, etc. Treat IMMEDIATELY with 15 grams of Carbs: 4 glucose tablets  cup regular juice/soda 2 tablespoons raisins 4 teaspoons sugar 1 tablespoon honey Recheck blood glucose in 15 mins and repeat above if still symptomatic/blood glucose <100.  RECOMMENDATIONS TO REDUCE YOUR RISK OF DIABETIC COMPLICATIONS: * Take your prescribed MEDICATION(S) * Follow a DIABETIC diet: Complex carbs, fiber  rich foods, (monounsaturated and polyunsaturated) fats * AVOID saturated/trans fats, high fat foods, >2,300 mg salt per day. * EXERCISE at least 5 times a week for 30 minutes or preferably daily.  * DO NOT SMOKE OR DRINK more than 1 drink a day. * Check your FEET every day. Do not wear tightfitting shoes. Contact us  if you develop an ulcer * See your EYE doctor once a year or more if needed * Get a FLU shot once a year * Get a PNEUMONIA vaccine once before and once after age 25 years  GOALS:  * Your Hemoglobin A1c of <7%  * fasting sugars need to be 80-130 * after meals sugars need to be <180 (2h after you start eating) * Your Systolic BP should be 130 or lower  * Your Diastolic BP should be 80 or lower  * Your HDL (Good Cholesterol) should be 40 or higher  * Your LDL (Bad Cholesterol) should be ideally <70. * Your Triglycerides should be 150 or lower  * Your Urine microalbumin (kidney function) should be <30 * Your Body Mass Index should be 25 or lower   Please consider the following ways to cut down carbs and fat and increase fiber and micronutrients in your diet: - substitute whole grain for white bread or pasta - substitute brown rice for white rice - substitute 90-calorie flat bread pieces for slices of bread when possible - substitute sweet potatoes or yams for white potatoes - substitute humus for margarine - substitute tofu for cheese when possible - substitute almond or rice milk for regular milk (would not drink soy milk daily due to concern for  soy estrogen influence on breast cancer risk) - substitute dark chocolate for other sweets when possible - substitute water - can add lemon or orange slices for taste - for diet sodas (artificial sweeteners will trick your body that you can eat sweets without getting calories and will lead you to overeating and weight gain in the long run) - do not skip breakfast or other meals (this will slow down the metabolism and will result in  more weight gain over time)  - can try smoothies made from fruit and almond/rice milk in am instead of regular breakfast - can also try old-fashioned (not instant) oatmeal made with almond/rice milk in am - order the dressing on the side when eating salad at a restaurant (pour less than half of the dressing on the salad) - eat as little meat as possible - can try juicing, but should not forget that juicing will get rid of the fiber, so would alternate with eating raw veg./fruits or drinking smoothies - use as little oil as possible, even when using olive oil - can dress a salad with a mix of balsamic vinegar and lemon juice, for e.g. - use agave nectar, stevia sugar, or regular sugar rather than artificial sweateners - steam or broil/roast veggies  - snack on veggies/fruit/nuts (unsalted, preferably) when possible, rather than processed foods - reduce or eliminate aspartame in diet (it is in diet sodas, chewing gum, etc) Read the labels!  Try to read Dr. Rosalynn Points book: Program for Reversing Diabetes for other ideas for healthy eating.

## 2024-01-03 LAB — LIPID PANEL W/REFLEX DIRECT LDL
Cholesterol: 132 mg/dL (ref <200)
HDL: 40 mg/dL (ref >=40)
LDL Cholesterol (Calc): 79 mg/dL
Non-HDL Cholesterol (Calc): 92 mg/dL (ref <130)
Total CHOL/HDL Ratio: 3.3 (calc) (ref <5.0)
Triglycerides: 57 mg/dL (ref <150)

## 2024-01-03 LAB — MICROALBUMIN / CREATININE URINE RATIO
Creatinine, Urine: 216 mg/dL (ref 20–320)
Microalb Creat Ratio: 2 mg/g{creat} (ref <30)
Microalb, Ur: 0.5 mg/dL

## 2024-01-06 ENCOUNTER — Ambulatory Visit: Payer: Self-pay | Admitting: Internal Medicine

## 2024-01-28 ENCOUNTER — Encounter: Admitting: Skilled Nursing Facility1
